# Patient Record
Sex: Male | Born: 1965 | Race: White | Hispanic: No | Marital: Married | Smoking: Never smoker
Health system: Southern US, Community
[De-identification: ages and names within clinical notes are randomized; demographics above are authoritative.]

## PROBLEM LIST (undated history)

## (undated) DIAGNOSIS — M792 Neuralgia and neuritis, unspecified: Secondary | ICD-10-CM

## (undated) DIAGNOSIS — F419 Anxiety disorder, unspecified: Secondary | ICD-10-CM

## (undated) DIAGNOSIS — G709 Myoneural disorder, unspecified: Secondary | ICD-10-CM

## (undated) DIAGNOSIS — E785 Hyperlipidemia, unspecified: Secondary | ICD-10-CM

## (undated) DIAGNOSIS — F32A Depression, unspecified: Secondary | ICD-10-CM

## (undated) HISTORY — PX: KNEE SURGERY: SHX244

## (undated) HISTORY — PX: ANKLE SURGERY: SHX546

## (undated) HISTORY — DX: Hyperlipidemia, unspecified: E78.5

## (undated) HISTORY — DX: Neuralgia and neuritis, unspecified: M79.2

## (undated) HISTORY — PX: COLONOSCOPY: SHX174

## (undated) HISTORY — PX: DENTAL EXAMINATION UNDER ANESTHESIA: SHX1447

---

## 1998-06-23 HISTORY — PX: HERNIA REPAIR: SHX51

## 2001-01-01 ENCOUNTER — Ambulatory Visit (HOSPITAL_COMMUNITY): Admission: RE | Admit: 2001-01-01 | Discharge: 2001-01-01 | Payer: Self-pay | Admitting: Neurosurgery

## 2001-01-01 ENCOUNTER — Encounter: Payer: Self-pay | Admitting: Neurosurgery

## 2004-04-11 ENCOUNTER — Ambulatory Visit: Payer: Self-pay | Admitting: Physician Assistant

## 2013-08-29 ENCOUNTER — Institutional Professional Consult (permissible substitution): Payer: BC Managed Care – PPO | Admitting: Neurology

## 2013-09-05 ENCOUNTER — Encounter (INDEPENDENT_AMBULATORY_CARE_PROVIDER_SITE_OTHER): Payer: Self-pay

## 2013-09-05 ENCOUNTER — Ambulatory Visit (INDEPENDENT_AMBULATORY_CARE_PROVIDER_SITE_OTHER): Payer: BC Managed Care – PPO | Admitting: Neurology

## 2013-09-05 ENCOUNTER — Encounter: Payer: Self-pay | Admitting: Neurology

## 2013-09-05 ENCOUNTER — Telehealth: Payer: Self-pay | Admitting: Neurology

## 2013-09-05 VITALS — BP 137/56 | HR 58 | Ht 66.0 in | Wt 173.0 lb

## 2013-09-05 DIAGNOSIS — R202 Paresthesia of skin: Secondary | ICD-10-CM

## 2013-09-05 DIAGNOSIS — M79609 Pain in unspecified limb: Secondary | ICD-10-CM | POA: Insufficient documentation

## 2013-09-05 DIAGNOSIS — R209 Unspecified disturbances of skin sensation: Secondary | ICD-10-CM

## 2013-09-05 NOTE — Telephone Encounter (Signed)
Hector HarmanDana, let him know, I have ordered pain management refer

## 2013-09-05 NOTE — Progress Notes (Signed)
PATIENT: Hector Gonzalez DOB: 30-Sep-1965  HISTORICAL  Hector Gonzalez is a 48 years old right-handed Caucasian male, accompanied by his wife, referred by his primary care physician Dr. Sherryll Burger for evaluation of the achy pain  He has past medical history of hyperlipidemia, diet controlled, depression, anxiety, he complains 25 years history of intermittent body aching pain,  Initial symptoms started about 25 years ago, 1990s, he started to experience hot needle sensation from his mid section, going down to bilateral groin area, bilateral legs, lasting for 18 months, he was put on pain medication, his symptoms gradually disappeared,  About 8 years ago, in 2007, he began to experience similar pain again, bilateral groin area shooting pain, bilateral calf area needle prick sensation, he was put on opana er 20 mg twice a day, also underwent extensive neurological evaluations, was reported normal, I reviewed his MRI of cervical in 2002, there is mild multilevel degenerative disc disease, but there was no significant foraminal or canal stenosis, he also had MRI lumbar in 2001, there was no significant foraminal, or canal stenosis. He  is also taking gabapentin 300 mg 3 times a day, setraline 125 mg every day, his symptoms was under good control until about 2 months ago, in January 2015, he began to experience flareup of the pain again, complains of bilateral calf area needle sensation, intermittent bilateral shoulder, forearm pain, and also behind ear pain,  But there was no limitation on his daily function, he was able to exercise one hour a day, including weight shifting, without any difficulty, he denies gait difficulty, he denies bowel and bladder incontinence  Recent laboratory showed normal CMP, CBC, elevated LDL 235, triglyceride 2:15, normal B12 891, TSH 2 point 1 7  REVIEW OF SYSTEMS: Full 14 system review of systems performed and notable only for itching, diarrhea, feeling cold, skin sensitivity,  tremor, depression, anxiety, not enough sleep  ALLERGIES: No Known Allergies  HOME MEDICATIONS: Gabapentin 300 mg 3 times a day Zoloft 125 mg a day Opana ER and 20 mg twice a day Ibuprofen 200 mg 6 tablets a day B complex   PAST MEDICAL HISTORY: Past Medical History  Diagnosis Date  . Nerve pain     PAST SURGICAL HISTORY: Past Surgical History  Procedure Laterality Date  . Ankle surgery    . Knee surgery      FAMILY HISTORY: Family History  Problem Relation Age of Onset  . Cancer    . Diabetes    . Mental illness      SOCIAL HISTORY:  History   Social History  . Marital Status: Single    Spouse Name: N/A    Number of Children: 1  . Years of Education: 12   Occupational History  .      [employer]   Social History Main Topics  . Smoking status: Never Smoker   . Smokeless tobacco: Never Used  . Alcohol Use: No  . Drug Use: No  . Sexual Activity: Not on file   Other Topics Concern  . Not on file   Social History Narrative   Patients lives at home with his wife [name] and works full time for [employer] school education   Right handed   Caffeine quit      PHYSICAL EXAM   Filed Vitals:   09/05/13 0814  BP: 137/56  Pulse: 58  Height: 5\' 6"  (1.676 m)  Weight: 173 lb (78.472 kg)  Not recorded    Body mass index is 27.94 kg/(m^2).   Generalized: In no acute distress  Neck: Supple, no carotid bruits   Cardiac: Regular rate rhythm  Pulmonary: Clear to auscultation bilaterally  Musculoskeletal: No deformity  Neurological examination  Mentation: Alert oriented to time, place, history taking, and causual conversation  Cranial nerve II-XII: Pupils were equal round reactive to light. Extraocular movements were full.  Visual field were full on confrontational test. Bilateral fundi were sharp.  Facial sensation and strength were normal. Hearing was intact to finger rubbing bilaterally. Uvula tongue midline.  Head turning and  shoulder shrug and were normal and symmetric.Tongue protrusion into cheek strength was normal.  Motor: Normal tone, bulk and strength.  Sensory: Intact to fine touch, pinprick, preserved vibratory sensation, and proprioception at toes.  Coordination: Normal finger to nose, heel-to-shin bilaterally there was no truncal ataxia  Gait: Rising up from seated position without assistance, normal stance, without trunk ataxia, moderate stride, good arm swing, smooth turning, able to perform tiptoe, and heel walking without difficulty.   Romberg signs: Negative  Deep tendon reflexes: Brachioradialis 2/2, biceps 2/2, triceps 2/2, patellar 3/3, Achilles 2/2, plantar responses were flexor bilaterally.   DIAGNOSTIC DATA (LABS, IMAGING, TESTING) - I reviewed patient records, labs, notes, testing and imaging myself where available.  ASSESSMENT AND PLAN  Mariea StableMickey G Boening is a 48 y.o. male with complains of diffuse body aching pain, essentially normal neurological examinations  I will do blood evaluation to rule out myositis, inflammatory myopathy, I will call him  report,   There is no evidence of peripheral neuropathy, he has brisk bilateral lower extremity deep tendon reflexes, but he is highly function, denies significant neck pain, differentiation diagnosis including cervical spondylitic myelopathy, after discussed with patient, will hold off imaging study at this point,  He may try to optimize pain management through his primary care physician, may consider tapering of gabapentin, add on cymbalta  Ami return to clinic for new issues   Levert FeinsteinYijun Natelie Ostrosky, M.D. Ph.D.  [facility] [address], [address] [place], [place] [address] [phone]

## 2013-09-05 NOTE — Telephone Encounter (Signed)
Message copied by Levert FeinsteinYAN, Jatavis Malek on Mon Sep 05, 2013  4:55 PM ------      Message from: COX, DANA R      Created: Mon Sep 05, 2013 10:23 AM       Patients wants a referral to pain Management. ------

## 2013-09-06 LAB — ANA W/REFLEX IF POSITIVE: Anti Nuclear Antibody(ANA): NEGATIVE

## 2013-09-06 LAB — SEDIMENTATION RATE: Sed Rate: 6 mm/hr (ref 0–30)

## 2013-09-06 LAB — CK: Total CK: 269 U/L — ABNORMAL HIGH (ref 24–204)

## 2013-09-06 LAB — C-REACTIVE PROTEIN: CRP: 0.6 mg/L (ref 0.0–4.9)

## 2013-09-07 NOTE — Progress Notes (Signed)
Quick Note:  Called to share normal labs per Dr Terrace ArabiaYan, patient verbalized understanding ______

## 2014-12-04 ENCOUNTER — Other Ambulatory Visit: Payer: Self-pay | Admitting: Family Medicine

## 2014-12-04 DIAGNOSIS — M7918 Myalgia, other site: Secondary | ICD-10-CM

## 2014-12-19 ENCOUNTER — Ambulatory Visit (INDEPENDENT_AMBULATORY_CARE_PROVIDER_SITE_OTHER): Payer: BLUE CROSS/BLUE SHIELD | Admitting: Family Medicine

## 2014-12-19 ENCOUNTER — Encounter: Payer: Self-pay | Admitting: Family Medicine

## 2014-12-19 VITALS — BP 124/74 | HR 70 | Temp 97.9°F | Ht 66.0 in | Wt 166.2 lb

## 2014-12-19 DIAGNOSIS — F1120 Opioid dependence, uncomplicated: Secondary | ICD-10-CM | POA: Insufficient documentation

## 2014-12-19 DIAGNOSIS — M7918 Myalgia, other site: Secondary | ICD-10-CM

## 2014-12-19 DIAGNOSIS — G8929 Other chronic pain: Secondary | ICD-10-CM

## 2014-12-19 DIAGNOSIS — M792 Neuralgia and neuritis, unspecified: Secondary | ICD-10-CM | POA: Insufficient documentation

## 2014-12-19 DIAGNOSIS — E785 Hyperlipidemia, unspecified: Secondary | ICD-10-CM | POA: Insufficient documentation

## 2014-12-19 DIAGNOSIS — M791 Myalgia: Secondary | ICD-10-CM

## 2014-12-19 DIAGNOSIS — Z79891 Long term (current) use of opiate analgesic: Secondary | ICD-10-CM | POA: Insufficient documentation

## 2014-12-19 DIAGNOSIS — F329 Major depressive disorder, single episode, unspecified: Secondary | ICD-10-CM | POA: Insufficient documentation

## 2014-12-19 DIAGNOSIS — Z1389 Encounter for screening for other disorder: Secondary | ICD-10-CM | POA: Insufficient documentation

## 2014-12-19 DIAGNOSIS — F32A Depression, unspecified: Secondary | ICD-10-CM | POA: Insufficient documentation

## 2014-12-19 MED ORDER — OXYMORPHONE HCL ER 5 MG PO TB12: 5.0000 mg | ORAL_TABLET | Freq: Every morning | ORAL | Status: DC

## 2014-12-19 MED ORDER — OXYMORPHONE HCL ER 20 MG PO TB12: 20.0000 mg | ORAL_TABLET | Freq: Two times a day (BID) | ORAL | Status: DC

## 2014-12-19 NOTE — Progress Notes (Signed)
Name: Hector Gonzalez   MRN: [id number]    DOB: Oct 27, 1965   Date:12/19/2014       Progress Note  Subjective  Chief Complaint  Chief Complaint  Patient presents with  . Back Pain    1 month f/u on med    HPI Pt. Is here for follow up of myofascial pain syndrome. Pain starts in the lower back and radiates forward to his groin and inner thighs. Pain is described as sharp and burning. It is a 3/10 today. Pain is worse with activity, bending, and twisting, while pain is relieved with pain medications, and resting. Additional treatment includes Sertraline, and Gabapentin.    Past Medical History  Diagnosis Date  . Nerve pain     Past Surgical History  Procedure Laterality Date  . Ankle surgery    . Knee surgery      Family History  Problem Relation Age of Onset  . Cancer    . Diabetes    . Mental illness    . Cancer Mother     History   Social History  . Marital Status: Single    Spouse Name: N/A  . Number of Children: 1  . Years of Education: 12   Occupational History  .      [employer]   Social History Main Topics  . Smoking status: Never Smoker   . Smokeless tobacco: Never Used  . Alcohol Use: No  . Drug Use: No  . Sexual Activity: Not on file   Other Topics Concern  . Not on file   Social History Narrative   Patients lives at home with his wife [name] and works full time for [employer] school education   Right handed   Caffeine quit      Current outpatient prescriptions:  .  b complex vitamins capsule, Take 1 capsule by mouth daily., Disp: , Rfl:  .  gabapentin (NEURONTIN) 600 MG tablet, Take 600 mg by mouth daily., Disp: , Rfl:  .  ibuprofen (ADVIL,MOTRIN) 200 MG tablet, Take 200 mg by mouth every 6 (six) hours as needed., Disp: , Rfl:  .  oxymorphone (OPANA ER) 20 MG 12 hr tablet, Take 20 mg by mouth daily., Disp: , Rfl:  .  sertraline (ZOLOFT) 100 MG tablet, TAKE ONE TABLET BY MOUTH EVERY DAY, Disp: 30 tablet, Rfl: 2  No Known  Allergies   Review of Systems  Cardiovascular: Negative for chest pain.  Gastrointestinal: Negative for abdominal pain.  Genitourinary: Negative for dysuria, urgency and frequency.  Musculoskeletal: Positive for myalgias and back pain.  Psychiatric/Behavioral: Negative for depression. The patient is not nervous/anxious.       Objective  Filed Vitals:   12/19/14 1638  BP: 124/74  Pulse: 70  Temp: 97.9 F (36.6 C)  TempSrc: Oral  Height:  (1.676 m)  Weight: 166 lb 3.2 oz (75.388 kg)  SpO2: 99%    Physical Exam  Constitutional: He is well-developed, well-nourished, and in no distress.  HENT:  Head: Normocephalic and atraumatic.  Cardiovascular: Normal rate and regular rhythm.   Pulmonary/Chest: Effort normal and breath sounds normal.  Abdominal: Soft. Bowel sounds are normal.  Musculoskeletal:       Lumbar back: He exhibits spasm. He exhibits no tenderness and no pain.  Nursing note and vitals reviewed.      No results found for this or any previous visit (from the past 2160 hour(s)).   Assessment & Plan 1. Chronic  myofascial pain Patient has chronic myofascial pain, well controlled on present opioid, antidepressant, and anticonvulsant therapy. Patient is blind with the controlled substances agreement. Refills provided follow-up in one month. - oxymorphone (OPANA ER) 20 MG 12 hr tablet; Take 1 tablet (20 mg total) by mouth every 12 (twelve) hours.  Dispense: 60 tablet; Refill: 0 - oxymorphone (OPANA ER) 5 MG 12 hr tablet; Take 1 tablet (5 mg total) by mouth every morning. Take with Opana ER 20 mg AM dose.  Dispense: 30 tablet; Refill: 0  There are no diagnoses linked to this encounter.  Aurthur Wingerter Asad A. [facility] 12/19/2014 5:10 PM

## 2015-01-18 ENCOUNTER — Ambulatory Visit (INDEPENDENT_AMBULATORY_CARE_PROVIDER_SITE_OTHER): Payer: BLUE CROSS/BLUE SHIELD | Admitting: Family Medicine

## 2015-01-18 ENCOUNTER — Encounter (INDEPENDENT_AMBULATORY_CARE_PROVIDER_SITE_OTHER): Payer: Self-pay

## 2015-01-18 ENCOUNTER — Encounter: Payer: Self-pay | Admitting: Family Medicine

## 2015-01-18 ENCOUNTER — Other Ambulatory Visit: Payer: Self-pay | Admitting: Family Medicine

## 2015-01-18 VITALS — BP 118/79 | HR 68 | Temp 98.0°F | Resp 17 | Ht 66.0 in | Wt 165.9 lb

## 2015-01-18 DIAGNOSIS — M7918 Myalgia, other site: Secondary | ICD-10-CM

## 2015-01-18 DIAGNOSIS — G8929 Other chronic pain: Secondary | ICD-10-CM | POA: Diagnosis not present

## 2015-01-18 DIAGNOSIS — M791 Myalgia: Secondary | ICD-10-CM

## 2015-01-18 MED ORDER — OXYMORPHONE HCL ER 20 MG PO TB12: 20.0000 mg | ORAL_TABLET | Freq: Two times a day (BID) | ORAL | Status: DC

## 2015-01-18 MED ORDER — OXYMORPHONE HCL ER 5 MG PO TB12: 5.0000 mg | ORAL_TABLET | Freq: Every morning | ORAL | Status: DC

## 2015-01-18 NOTE — Progress Notes (Signed)
Name: Hector Gonzalez   MRN: [id number]    DOB: September 11, 1965   Date:01/18/2015       Progress Note  Subjective  Chief Complaint  Chief Complaint  Patient presents with  . Medication Refill  . Hyperlipidemia    HPI Pt. Is here for medication refills for chronic myofascial pain syndrome. He has chronic pain in his lower back, bilateral flank pain, and it radiates down to his upper thighs bilaterally. He is on long-acting oral opioid therapy, Gabapentin, and an anti-depressant. He reports that pain is well-controlled on current regimen and no side effects.   Past Medical History  Diagnosis Date  . Nerve pain   . Hyperlipidemia     Past Surgical History  Procedure Laterality Date  . Ankle surgery    . Knee surgery      Family History  Problem Relation Age of Onset  . Cancer    . Diabetes    . Mental illness    . Cancer Mother     History   Social History  . Marital Status: Single    Spouse Name: N/A  . Number of Children: 1  . Years of Education: 12   Occupational History  .      [employer]   Social History Main Topics  . Smoking status: Never Smoker   . Smokeless tobacco: Never Used  . Alcohol Use: No  . Drug Use: No  . Sexual Activity: Not on file   Other Topics Concern  . Not on file   Social History Narrative   Patients lives at home with his wife Hector Gonzalez and works full time for [employer] school education   Right handed   Caffeine quit      Current outpatient prescriptions:  .  b complex vitamins capsule, Take 1 capsule by mouth daily., Disp: , Rfl:  .  gabapentin (NEURONTIN) 600 MG tablet, Take 600 mg by mouth daily., Disp: , Rfl:  .  ibuprofen (ADVIL,MOTRIN) 200 MG tablet, Take 200 mg by mouth every 6 (six) hours as needed., Disp: , Rfl:  .  oxymorphone (OPANA ER) 20 MG 12 hr tablet, Take 1 tablet (20 mg total) by mouth every 12 (twelve) hours., Disp: 60 tablet, Rfl: 0 .  oxymorphone (OPANA ER) 5 MG 12 hr tablet, Take 1 tablet (5 mg  total) by mouth every morning. Take with Opana ER 20 mg AM dose., Disp: 30 tablet, Rfl: 0 .  sertraline (ZOLOFT) 100 MG tablet, TAKE ONE TABLET BY MOUTH EVERY DAY, Disp: 30 tablet, Rfl: 2  No Known Allergies   Review of Systems  Gastrointestinal: Negative for abdominal pain.  Musculoskeletal: Positive for myalgias and back pain.      Objective  Filed Vitals:   01/18/15 1521  BP: 118/79  Pulse: 68  Temp: 98 F (36.7 C)  TempSrc: Oral  Resp: 17  Height: 5\' 6"  (1.676 m)  Weight: 165 lb 14.4 oz (75.252 kg)  SpO2: 97%    Physical Exam  Constitutional: He is well-developed, well-nourished, and in no distress.  Cardiovascular: Normal rate and regular rhythm.   Pulmonary/Chest: Effort normal and breath sounds normal.  Abdominal: Soft. Bowel sounds are normal.  Musculoskeletal:       Lumbar back: He exhibits normal range of motion, no tenderness, no pain and no spasm.  Nursing note and vitals reviewed.     Assessment & Plan 1. Chronic myofascial pain Symptoms of chronic myofascial pain are stable and  controlled on present opioid therapy. Patient is compliant with the controlled substances agreement. Refills provided and follow-up in one month - oxymorphone (OPANA ER) 20 MG 12 hr tablet; Take 1 tablet (20 mg total) by mouth every 12 (twelve) hours.  Dispense: 60 tablet; Refill: 0 - oxymorphone (OPANA ER) 5 MG 12 hr tablet; Take 1 tablet (5 mg total) by mouth every morning. Take with Opana ER 20 mg AM dose.  Dispense: 30 tablet; Refill: 0    Hector Gonzalez [name] 01/18/2015 3:51 PM

## 2015-02-15 ENCOUNTER — Ambulatory Visit (INDEPENDENT_AMBULATORY_CARE_PROVIDER_SITE_OTHER): Payer: BLUE CROSS/BLUE SHIELD | Admitting: Family Medicine

## 2015-02-15 ENCOUNTER — Encounter: Payer: Self-pay | Admitting: Family Medicine

## 2015-02-15 VITALS — BP 118/75 | HR 78 | Temp 98.6°F | Resp 18 | Ht 66.0 in | Wt 165.4 lb

## 2015-02-15 DIAGNOSIS — M791 Myalgia: Secondary | ICD-10-CM

## 2015-02-15 DIAGNOSIS — G8929 Other chronic pain: Secondary | ICD-10-CM | POA: Diagnosis not present

## 2015-02-15 DIAGNOSIS — M25522 Pain in left elbow: Secondary | ICD-10-CM | POA: Insufficient documentation

## 2015-02-15 DIAGNOSIS — M7918 Myalgia, other site: Secondary | ICD-10-CM

## 2015-02-15 MED ORDER — OXYMORPHONE HCL ER 5 MG PO TB12: 5.0000 mg | ORAL_TABLET | Freq: Every morning | ORAL | Status: DC

## 2015-02-15 MED ORDER — OXYMORPHONE HCL ER 20 MG PO TB12: 20.0000 mg | ORAL_TABLET | Freq: Two times a day (BID) | ORAL | Status: DC

## 2015-02-15 NOTE — Progress Notes (Signed)
Name: Hector Gonzalez   MRN: [id number]    DOB: 06-03-1966   Date:02/15/2015       Progress Note  Subjective  Chief Complaint  Chief Complaint  Patient presents with  . Medication Refill    Opana ER 20 mg twice a day / 5 mg once in the morning daily  . Hyperlipidemia    HPI Left. Elbow Pain Pt. Is here for left elbow pain, present for 2 weeks. Pain started without any known incident. Pt. Has difficulty grabbing a cup of coffee two days ago, and still has pain on making a grasping movement with his hand.  Pt. also has history of myofascial pain syndrome, described as radiating pain starting in the lower back and traveling down his pelvic area and down to his upper thighs. Pain is controlled on Opana 20 mg ER twice daily and 5 mg once in the morning. In addition, he also takes Gabapentin and Setraline. No side effects reported from opioid therapy.  Past Medical History  Diagnosis Date  . Nerve pain   . Hyperlipidemia     Past Surgical History  Procedure Laterality Date  . Ankle surgery    . Knee surgery      Family History  Problem Relation Age of Onset  . Cancer    . Diabetes    . Mental illness    . Cancer Mother     Social History   Social History  . Marital Status: Single    Spouse Name: N/A  . Number of Children: 1  . Years of Education: 12   Occupational History  .      [employer]   Social History Main Topics  . Smoking status: Never Smoker   . Smokeless tobacco: Never Used  . Alcohol Use: No  . Drug Use: No  . Sexual Activity: Not on file   Other Topics Concern  . Not on file   Social History Narrative   Patients lives at home with his wife Hector Gonzalez and works full time for [employer] school education   Right handed   Caffeine quit      Current outpatient prescriptions:  .  b complex vitamins capsule, Take 1 capsule by mouth daily., Disp: , Rfl:  .  gabapentin (NEURONTIN) 600 MG tablet, Take 600 mg by mouth daily., Disp: , Rfl:  .   ibuprofen (ADVIL,MOTRIN) 200 MG tablet, Take 200 mg by mouth every 6 (six) hours as needed., Disp: , Rfl:  .  oxymorphone (OPANA ER) 20 MG 12 hr tablet, Take 1 tablet (20 mg total) by mouth every 12 (twelve) hours., Disp: 60 tablet, Rfl: 0 .  oxymorphone (OPANA ER) 5 MG 12 hr tablet, Take 1 tablet (5 mg total) by mouth every morning. Take with Opana ER 20 mg AM dose., Disp: 30 tablet, Rfl: 0 .  sertraline (ZOLOFT) 100 MG tablet, TAKE ONE TABLET BY MOUTH EVERY DAY, Disp: 30 tablet, Rfl: 2  No Known Allergies   Review of Systems  Constitutional: Negative for fever, chills and weight loss.  Cardiovascular: Negative for chest pain and palpitations.  Gastrointestinal: Negative for abdominal pain.  Musculoskeletal: Positive for back pain and joint pain.      Objective  Filed Vitals:   02/15/15 1614  BP: 118/75  Pulse: 78  Temp: 98.6 F (37 C)  TempSrc: Oral  Resp: 18  Height:  (1.676 m)  Weight: 165 lb 6.4 oz (75.025 kg)  SpO2: 96%  Physical Exam  Constitutional: He is oriented to person, place, and time and well-developed, well-nourished, and in no distress.  Cardiovascular: Normal rate and regular rhythm.   Pulmonary/Chest: Effort normal and breath sounds normal.  Abdominal: Soft. Bowel sounds are normal.  Musculoskeletal:       Left elbow: He exhibits no swelling and no deformity. Tenderness found. Lateral epicondyle tenderness noted.       Lumbar back: He exhibits normal range of motion, no tenderness and no pain.  Neurological: He is alert and oriented to person, place, and time.  Skin: Skin is warm and dry.  Psychiatric: Memory, affect and judgment normal.  Nursing note and vitals reviewed.   Assessment & Plan  1. Chronic myofascial pain Stable on present opioid therapy. Patient is aware of the dependence potential, side effects, and drug interactions of opioids. He is compliant with the controlled substances agreement. Refills provided and follow-up in one  month.  - oxymorphone (OPANA ER) 20 MG 12 hr tablet; Take 1 tablet (20 mg total) by mouth every 12 (twelve) hours.  Dispense: 60 tablet; Refill: 0 - oxymorphone (OPANA ER) 5 MG 12 hr tablet; Take 1 tablet (5 mg total) by mouth every morning. Take with Opana ER 20 mg AM dose.  Dispense: 30 tablet; Refill: 0  2. Pain in left elbow Pt. advised to use OTC NSAIDs as needed. Obtain x-ray for evaluation.  - DG Elbow 2 Views Left; Future   [name] [name] 02/15/2015 4:27 PM

## 2015-03-05 ENCOUNTER — Other Ambulatory Visit: Payer: Self-pay | Admitting: Family Medicine

## 2015-03-06 NOTE — Telephone Encounter (Signed)
Medication has been refilled and sent to [facility] 

## 2015-03-19 ENCOUNTER — Ambulatory Visit (INDEPENDENT_AMBULATORY_CARE_PROVIDER_SITE_OTHER): Payer: BLUE CROSS/BLUE SHIELD | Admitting: Family Medicine

## 2015-03-19 ENCOUNTER — Encounter: Payer: Self-pay | Admitting: Family Medicine

## 2015-03-19 VITALS — BP 120/72 | HR 72 | Temp 97.8°F | Resp 16 | Ht 66.0 in | Wt 168.7 lb

## 2015-03-19 DIAGNOSIS — L749 Eccrine sweat disorder, unspecified: Secondary | ICD-10-CM | POA: Insufficient documentation

## 2015-03-19 DIAGNOSIS — M7918 Myalgia, other site: Secondary | ICD-10-CM

## 2015-03-19 DIAGNOSIS — M791 Myalgia: Secondary | ICD-10-CM

## 2015-03-19 DIAGNOSIS — G8929 Other chronic pain: Secondary | ICD-10-CM | POA: Diagnosis not present

## 2015-03-19 MED ORDER — OXYMORPHONE HCL ER 20 MG PO TB12: 20.0000 mg | ORAL_TABLET | Freq: Two times a day (BID) | ORAL | Status: DC

## 2015-03-19 MED ORDER — OXYMORPHONE HCL ER 5 MG PO TB12: 5.0000 mg | ORAL_TABLET | Freq: Every morning | ORAL | Status: DC

## 2015-03-19 NOTE — Progress Notes (Signed)
Name: Hector Gonzalez   MRN: [id number]    DOB: 20-May-1966   Date:03/19/2015       Progress Note  Subjective  Chief Complaint  Chief Complaint  Patient presents with  . Pain    patient is here for his 68-month f/u for Chronic Myofascial Pain  . Medication Refill    Opana  &     HPI  Myofascial Pain Syndrome  Pt. Is here for follow up of myofascial pain syndrome, described as pain radiating from his lower back down into his lower abdomen and thighs. He is on Opana 20 mg two times daily and 5 mg daily. He is also taking Gabapentin and Sertaline. Pain is controlled. No medication side effects reported.   Change in Sweat Odor Noticed over 2-3 months ago, change in sweat odor from the usual to one resembling acidic, vinegar-like smell. He has noticed no changes in appetite, no weight loss, no GI symptoms. He has not changed his diet in any way.   Past Medical History  Diagnosis Date  . Nerve pain   . Hyperlipidemia     Past Surgical History  Procedure Laterality Date  . Ankle surgery    . Knee surgery      Family History  Problem Relation Age of Onset  . Cancer    . Diabetes    . Mental illness    . Cancer Mother     Social History   Social History  . Marital Status: Single    Spouse Name: N/A  . Number of Children: 1  . Years of Education: 12   Occupational History  .      [employer]   Social History Main Topics  . Smoking status: Never Smoker   . Smokeless tobacco: Never Used  . Alcohol Use: No  . Drug Use: No  . Sexual Activity: Not on file   Other Topics Concern  . Not on file   Social History Narrative   Patients lives at home with his wife Hector Gonzalez and works full time for [employer] school education   Right handed   Caffeine quit     Current outpatient prescriptions:  .  b complex vitamins capsule, Take 1 capsule by mouth daily., Disp: , Rfl:  .  gabapentin (NEURONTIN) 600 MG tablet, Take 600 mg by mouth daily., Disp: , Rfl:  .   ibuprofen (ADVIL,MOTRIN) 200 MG tablet, Take 200 mg by mouth every 6 (six) hours as needed., Disp: , Rfl:  .  oxymorphone (OPANA ER) 20 MG 12 hr tablet, Take 1 tablet (20 mg total) by mouth every 12 (twelve) hours., Disp: 60 tablet, Rfl: 0 .  oxymorphone (OPANA ER) 5 MG 12 hr tablet, Take 1 tablet (5 mg total) by mouth every morning. Take with Opana ER 20 mg AM dose., Disp: 30 tablet, Rfl: 0 .  sertraline (ZOLOFT) 100 MG tablet, Take 1 tablet (100 mg total) by mouth daily., Disp: 30 tablet, Rfl: 0  No Known Allergies   Review of Systems  Gastrointestinal: Positive for abdominal pain.  Musculoskeletal: Positive for back pain and joint pain.   Objective  Filed Vitals:   03/19/15 1619  BP: 120/72  Pulse: 72  Temp: 97.8 F (36.6 C)  TempSrc: Oral  Resp: 16  Height:  (1.676 m)  Weight: 168 lb 11.2 oz (76.522 kg)  SpO2: 97%    Physical Exam  Constitutional: He is well-developed, well-nourished, and in no distress.  HENT:  Head: Normocephalic and atraumatic.  Cardiovascular: Normal rate and regular rhythm.   Pulmonary/Chest: Effort normal and breath sounds normal.  Abdominal: Soft. Bowel sounds are normal.  Nursing note and vitals reviewed.   Assessment & Plan  1. Chronic myofascial pain Symptoms stable on present opioid therapy. Refills provided. - oxymorphone (OPANA ER) 20 MG 12 hr tablet; Take 1 tablet (20 mg total) by mouth every 12 (twelve) hours.  Dispense: 60 tablet; Refill: 0 - oxymorphone (OPANA ER) 5 MG 12 hr tablet; Take 1 tablet (5 mg total) by mouth every morning. Take with Opana ER 20 mg AM dose.  Dispense: 30 tablet; Refill: 0  2. Sweating abnormality Unclear etiology for the change and slight border. Obtain labs and if unremarkable, consider referral to dermatology. - Hepatic function panel - Basic Metabolic Panel (BMET) - Urinalysis, Routine w reflex microscopic   Hector Gonzalez [facility] 03/19/2015 4:41 PM

## 2015-04-06 ENCOUNTER — Other Ambulatory Visit: Payer: Self-pay | Admitting: Family Medicine

## 2015-04-18 ENCOUNTER — Encounter: Payer: Self-pay | Admitting: Family Medicine

## 2015-04-18 ENCOUNTER — Other Ambulatory Visit: Payer: Self-pay | Admitting: Family Medicine

## 2015-04-18 ENCOUNTER — Ambulatory Visit (INDEPENDENT_AMBULATORY_CARE_PROVIDER_SITE_OTHER): Payer: BLUE CROSS/BLUE SHIELD | Admitting: Family Medicine

## 2015-04-18 VITALS — HR 75 | Temp 97.9°F | Resp 17 | Ht 66.0 in | Wt 166.2 lb

## 2015-04-18 DIAGNOSIS — M791 Myalgia: Secondary | ICD-10-CM

## 2015-04-18 DIAGNOSIS — G8929 Other chronic pain: Secondary | ICD-10-CM

## 2015-04-18 DIAGNOSIS — M7918 Myalgia, other site: Secondary | ICD-10-CM

## 2015-04-18 MED ORDER — OXYMORPHONE HCL ER 20 MG PO TB12: 20.0000 mg | ORAL_TABLET | Freq: Two times a day (BID) | ORAL | Status: DC

## 2015-04-18 MED ORDER — OXYMORPHONE HCL ER 5 MG PO TB12: 5.0000 mg | ORAL_TABLET | Freq: Every morning | ORAL | Status: DC

## 2015-04-18 NOTE — Progress Notes (Signed)
Name: Hector Gonzalez   MRN: [id number]    DOB: 10/15/1965   Date:04/18/2015       Progress Note  Subjective  Chief Complaint  Chief Complaint  Patient presents with  . Follow-up    1 mo  . Hyperlipidemia  . Medication Refill    opana 20mg  / gabapentin 600mg     HPI  Myofascial Pain Syndrome  Pt. Is here for follow up of myofascial pain syndrome, described as pain radiating from his lower back down into his lower abdomen and thighs. Pain is rated at 2-3/10He is on Opana 20 mg two times daily and 5 mg daily. He is also taking Gabapentin and Sertaline. Pain is controlled. No medication side effects reported.   Past Medical History  Diagnosis Date  . Nerve pain   . Hyperlipidemia     Past Surgical History  Procedure Laterality Date  . Ankle surgery    . Knee surgery      Family History  Problem Relation Age of Onset  . Cancer    . Diabetes    . Mental illness    . Cancer Mother     Social History   Social History  . Marital Status: Single    Spouse Name: N/A  . Number of Children: 1  . Years of Education: 12   Occupational History  .      [employer]   Social History Main Topics  . Smoking status: Never Smoker   . Smokeless tobacco: Never Used  . Alcohol Use: No  . Drug Use: No  . Sexual Activity: Not on file   Other Topics Concern  . Not on file   Social History Narrative   Patients lives at home with his wife Hector Gonzalez and works full time for [employer]   High school education   Right handed   Caffeine quit      Current outpatient prescriptions:  .  b complex vitamins capsule, Take 1 capsule by mouth daily., Disp: , Rfl:  .  gabapentin (NEURONTIN) 600 MG tablet, Take 600 mg by mouth daily., Disp: , Rfl:  .  ibuprofen (ADVIL,MOTRIN) 200 MG tablet, Take 200 mg by mouth every 6 (six) hours as needed., Disp: , Rfl:  .  oxymorphone (OPANA ER) 20 MG 12 hr tablet, Take 1 tablet (20 mg total) by mouth every 12 (twelve) hours., Disp: 60 tablet, Rfl: 0 .   oxymorphone (OPANA ER) 5 MG 12 hr tablet, Take 1 tablet (5 mg total) by mouth every morning. Take with Opana ER 20 mg AM dose., Disp: 30 tablet, Rfl: 0 .  sertraline (ZOLOFT) 100 MG tablet, TAKE ONE TABLET BY MOUTH DAILY, Disp: 30 tablet, Rfl: 2  No Known Allergies   Review of Systems  Gastrointestinal: Positive for abdominal pain.  Musculoskeletal: Positive for back pain.    Objective  Filed Vitals:   04/18/15 1619  Pulse: 75  Temp: 97.9 F (36.6 C)  TempSrc: Oral  Resp: 17  Height: 5\' 6"  (1.676 m)  Weight: 166 lb 3.2 oz (75.388 kg)  SpO2: 97%    Physical Exam  Constitutional: He is well-developed, well-nourished, and in no distress.  HENT:  Head: Normocephalic and atraumatic.  Cardiovascular: Normal rate and regular rhythm.   Pulmonary/Chest: Effort normal and breath sounds normal.  Abdominal: Soft. Bowel sounds are normal.  Nursing note and vitals reviewed.  Assessment & Plan  1. Chronic myofascial pain Symptoms stable on daily opioid therapy. Patient compliant with controlled  substances agreement. Refills provided and follow-up in one month. - oxymorphone (OPANA ER) 20 MG 12 hr tablet; Take 1 tablet (20 mg total) by mouth every 12 (twelve) hours.  Dispense: 60 tablet; Refill: 0 - oxymorphone (OPANA ER) 5 MG 12 hr tablet; Take 1 tablet (5 mg total) by mouth every morning. Take with Opana ER 20 mg AM dose.  Dispense: 30 tablet; Refill: 0   Hector Gonzalez [facility] 04/18/2015 4:52 PM

## 2015-04-19 LAB — URINALYSIS, ROUTINE W REFLEX MICROSCOPIC
Bilirubin, UA: NEGATIVE
GLUCOSE, UA: NEGATIVE
LEUKOCYTES UA: NEGATIVE
Nitrite, UA: NEGATIVE
PROTEIN UA: NEGATIVE
RBC, UA: NEGATIVE
Urobilinogen, Ur: 1 mg/dL (ref 0.2–1.0)
pH, UA: 6 (ref 5.0–7.5)

## 2015-04-19 LAB — HEPATIC FUNCTION PANEL
ALT: 38 IU/L (ref 0–44)
AST: 34 IU/L (ref 0–40)
Albumin: 4.5 g/dL (ref 3.5–5.5)
Alkaline Phosphatase: 58 IU/L (ref 39–117)
BILIRUBIN TOTAL: 0.6 mg/dL (ref 0.0–1.2)
BILIRUBIN, DIRECT: 0.16 mg/dL (ref 0.00–0.40)
Total Protein: 6.6 g/dL (ref 6.0–8.5)

## 2015-04-19 LAB — BASIC METABOLIC PANEL
BUN / CREAT RATIO: 11 (ref 9–20)
BUN: 13 mg/dL (ref 6–24)
CHLORIDE: 100 mmol/L (ref 97–106)
CO2: 27 mmol/L (ref 18–29)
Calcium: 9.3 mg/dL (ref 8.7–10.2)
Creatinine, Ser: 1.21 mg/dL (ref 0.76–1.27)
GFR calc Af Amer: 80 mL/min/{1.73_m2} (ref >59)
GFR calc non Af Amer: 69 mL/min/{1.73_m2} (ref >59)
GLUCOSE: 81 mg/dL (ref 65–99)
POTASSIUM: 4.6 mmol/L (ref 3.5–5.2)
Sodium: 143 mmol/L (ref 136–144)

## 2015-04-30 ENCOUNTER — Encounter (INDEPENDENT_AMBULATORY_CARE_PROVIDER_SITE_OTHER): Payer: Self-pay

## 2015-05-16 ENCOUNTER — Ambulatory Visit (INDEPENDENT_AMBULATORY_CARE_PROVIDER_SITE_OTHER): Payer: BLUE CROSS/BLUE SHIELD | Admitting: Family Medicine

## 2015-05-16 ENCOUNTER — Encounter: Payer: Self-pay | Admitting: Family Medicine

## 2015-05-16 VITALS — BP 118/79 | HR 75 | Temp 97.9°F | Resp 17 | Ht 66.0 in | Wt 168.6 lb

## 2015-05-16 DIAGNOSIS — H938X2 Other specified disorders of left ear: Secondary | ICD-10-CM

## 2015-05-16 DIAGNOSIS — M7918 Myalgia, other site: Secondary | ICD-10-CM

## 2015-05-16 DIAGNOSIS — J309 Allergic rhinitis, unspecified: Secondary | ICD-10-CM | POA: Diagnosis not present

## 2015-05-16 DIAGNOSIS — G8929 Other chronic pain: Secondary | ICD-10-CM | POA: Diagnosis not present

## 2015-05-16 DIAGNOSIS — M791 Myalgia: Secondary | ICD-10-CM | POA: Diagnosis not present

## 2015-05-16 MED ORDER — OXYMORPHONE HCL ER 20 MG PO TB12: 20.0000 mg | ORAL_TABLET | Freq: Two times a day (BID) | ORAL | Status: DC

## 2015-05-16 MED ORDER — FLUTICASONE PROPIONATE 50 MCG/ACT NA SUSP: 2.0000 | Freq: Every day | NASAL | Status: DC

## 2015-05-16 MED ORDER — OXYMORPHONE HCL ER 5 MG PO TB12: 5.0000 mg | ORAL_TABLET | Freq: Every morning | ORAL | Status: DC

## 2015-05-16 NOTE — Progress Notes (Signed)
Name: Hector Gonzalez   MRN: [id number]    DOB: 07/19/1965   Date:05/16/2015       Progress Note  Subjective  Chief Complaint  Chief Complaint  Patient presents with  . Follow-up    1 mo  . Hyperlipidemia  . Medication Refill    opana 20mg  / opana 5mg    Ear Fullness  There is pain in the left ear. This is a new problem. Episode onset: 6 weeks. There has been no fever. Pertinent negatives include no abdominal pain, coughing, ear discharge, headaches, neck pain or sore throat. He has tried nothing for the symptoms. There is no history of a chronic ear infection, hearing loss or a tympanostomy tube.    Pt. Is here for follow-up of myofascial pain syndrome. Pain is present in the lower back, radiating down to his inner thighs. Pain is rated at 3/10, controlled on Opana 20 mg at night and 25 mg in AM. He also takes SSRI and Gabapentin.No side effects.  Past Medical History  Diagnosis Date  . Nerve pain   . Hyperlipidemia     Past Surgical History  Procedure Laterality Date  . Ankle surgery    . Knee surgery      Family History  Problem Relation Age of Onset  . Cancer    . Diabetes    . Mental illness    . Cancer Mother     Social History   Social History  . Marital Status: Single    Spouse Name: N/A  . Number of Children: 1  . Years of Education: 12   Occupational History  .      [employer]   Social History Main Topics  . Smoking status: Never Smoker   . Smokeless tobacco: Never Used  . Alcohol Use: No  . Drug Use: No  . Sexual Activity: Not on file   Other Topics Concern  . Not on file   Social History Narrative   Patients lives at home with his wife Hector Gonzalez and works full time for [employer]   High school education   Right handed   Caffeine quit      Current outpatient prescriptions:  .  b complex vitamins capsule, Take 1 capsule by mouth daily., Disp: , Rfl:  .  gabapentin (NEURONTIN) 600 MG tablet, Take 600 mg by mouth daily., Disp: , Rfl:  .   ibuprofen (ADVIL,MOTRIN) 200 MG tablet, Take 200 mg by mouth every 6 (six) hours as needed., Disp: , Rfl:  .  oxymorphone (OPANA ER) 20 MG 12 hr tablet, Take 1 tablet (20 mg total) by mouth every 12 (twelve) hours., Disp: 60 tablet, Rfl: 0 .  oxymorphone (OPANA ER) 5 MG 12 hr tablet, Take 1 tablet (5 mg total) by mouth every morning. Take with Opana ER 20 mg AM dose., Disp: 30 tablet, Rfl: 0 .  sertraline (ZOLOFT) 100 MG tablet, TAKE ONE TABLET BY MOUTH DAILY, Disp: 30 tablet, Rfl: 2  No Known Allergies   Review of Systems  Constitutional: Negative for fever and chills.  HENT: Negative for ear discharge, ear pain, sore throat and tinnitus.   Respiratory: Negative for cough.   Gastrointestinal: Negative for abdominal pain.  Musculoskeletal: Positive for back pain. Negative for neck pain.  Neurological: Negative for headaches.    Objective  Filed Vitals:   05/16/15 1547  BP: 118/79  Pulse: 75  Temp: 97.9 F (36.6 C)  TempSrc: Oral  Resp: 17  Height: 5'  6" (1.676 m)  Weight: 168 lb 9.6 oz (76.476 kg)  SpO2: 97%    Physical Exam  Constitutional: He is well-developed, well-nourished, and in no distress.  HENT:  Head: Normocephalic and atraumatic.  Right Ear: Tympanic membrane and ear canal normal.  Left Ear: External ear and ear canal normal.  Nose: Mucosal edema present. No sinus tenderness. Right sinus exhibits no maxillary sinus tenderness and no frontal sinus tenderness. Left sinus exhibits no maxillary sinus tenderness and no frontal sinus tenderness.  Mouth/Throat: No posterior oropharyngeal erythema.  TM not visualized in left ear, canal normal, no drainage or cerumen impaction.  Nasal turbinate hypertrophy,   Cardiovascular: Normal rate and regular rhythm.   No murmur heard. Pulmonary/Chest: Effort normal and breath sounds normal.  Abdominal: Soft. Bowel sounds are normal.  Nursing note and vitals reviewed.    Assessment & Plan  1. Sensation of fullness in left  ear  - Ambulatory referral to ENT  2. Chronic myofascial pain  Pain is stable and controlled on current therapy. Compliant with controlled substances agreement. Refills provided  - oxymorphone (OPANA ER) 20 MG 12 hr tablet; Take 1 tablet (20 mg total) by mouth every 12 (twelve) hours.  Dispense: 60 tablet; Refill: 0 - oxymorphone (OPANA ER) 5 MG 12 hr tablet; Take 1 tablet (5 mg total) by mouth every morning. Take with Opana ER 20 mg AM dose.  Dispense: 30 tablet; Refill: 0  3. Allergic rhinitis, unspecified allergic rhinitis type  - fluticasone (FLONASE) 50 MCG/ACT nasal spray; Place 2 sprays into both nostrils daily.  Dispense: 16 g; Refill: 0    Marque Rademaker [name] 05/16/2015 4:17 PM

## 2015-06-05 ENCOUNTER — Encounter: Payer: Self-pay | Admitting: Family Medicine

## 2015-06-05 ENCOUNTER — Ambulatory Visit (INDEPENDENT_AMBULATORY_CARE_PROVIDER_SITE_OTHER): Payer: BLUE CROSS/BLUE SHIELD | Admitting: Family Medicine

## 2015-06-05 VITALS — BP 122/78 | HR 72 | Temp 98.2°F | Resp 18 | Ht 66.0 in | Wt 170.2 lb

## 2015-06-05 DIAGNOSIS — G8929 Other chronic pain: Secondary | ICD-10-CM | POA: Diagnosis not present

## 2015-06-05 DIAGNOSIS — M791 Myalgia: Secondary | ICD-10-CM

## 2015-06-05 DIAGNOSIS — M7918 Myalgia, other site: Secondary | ICD-10-CM

## 2015-06-05 MED ORDER — OXYMORPHONE HCL ER 20 MG PO TB12: 20.0000 mg | ORAL_TABLET | Freq: Two times a day (BID) | ORAL | Status: DC

## 2015-06-05 MED ORDER — OXYMORPHONE HCL ER 5 MG PO TB12: 5.0000 mg | ORAL_TABLET | Freq: Every morning | ORAL | Status: DC

## 2015-06-05 NOTE — Progress Notes (Signed)
Name: Hector Gonzalez   MRN: [id number]    DOB: 1965-11-13   Date:06/05/2015       Progress Note  Subjective  Chief Complaint  Chief Complaint  Patient presents with  . chronic myofascial pain    pt here for 1 month follow up    HPI   Pt. Is here for follow-up of myofascial pain syndrome. Pain is present in the lower back, radiating down to his inner thighs. Pain is rated at 3/10 today, controlled on Opana 20 mg at night and 25 mg in AM. He also takes Sertraline and Gabapentin along with opioid therapy. No side effects reported.   Past Medical History  Diagnosis Date  . Nerve pain   . Hyperlipidemia     Past Surgical History  Procedure Laterality Date  . Ankle surgery    . Knee surgery      Family History  Problem Relation Age of Onset  . Cancer    . Diabetes    . Mental illness    . Cancer Mother     Social History   Social History  . Marital Status: Single    Spouse Name: N/A  . Number of Children: 1  . Years of Education: 12   Occupational History  .      [employer]   Social History Main Topics  . Smoking status: Never Smoker   . Smokeless tobacco: Never Used  . Alcohol Use: No  . Drug Use: No  . Sexual Activity: Not on file   Other Topics Concern  . Not on file   Social History Narrative   Patients lives at home with his wife [name] and works full time for [employer] school education   Right handed   Caffeine quit      Current outpatient prescriptions:  .  b complex vitamins capsule, Take 1 capsule by mouth daily., Disp: , Rfl:  .  fluticasone (FLONASE) 50 MCG/ACT nasal spray, Place 2 sprays into both nostrils daily., Disp: 16 g, Rfl: 0 .  gabapentin (NEURONTIN) 600 MG tablet, Take 600 mg by mouth daily., Disp: , Rfl:  .  ibuprofen (ADVIL,MOTRIN) 200 MG tablet, Take 200 mg by mouth every 6 (six) hours as needed., Disp: , Rfl:  .  oxymorphone (OPANA ER) 20 MG 12 hr tablet, Take 1 tablet (20 mg total) by mouth every 12 (twelve) hours.,  Disp: 60 tablet, Rfl: 0 .  oxymorphone (OPANA ER) 5 MG 12 hr tablet, Take 1 tablet (5 mg total) by mouth every morning. Take with Opana ER 20 mg AM dose., Disp: 30 tablet, Rfl: 0 .  sertraline (ZOLOFT) 100 MG tablet, TAKE ONE TABLET BY MOUTH DAILY, Disp: 30 tablet, Rfl: 2  No Known Allergies   Review of Systems  Constitutional: Negative for fever, chills, weight loss and malaise/fatigue.  Musculoskeletal: Positive for myalgias and back pain.  Neurological: Negative for dizziness and tingling.    Objective  Filed Vitals:   06/05/15 1525  BP: 122/78  Pulse: 72  Temp: 98.2 F (36.8 C)  Resp: 18  Height:  (1.676 m)  Weight: 170 lb 4 oz (77.225 kg)  SpO2: 97%    Physical Exam  Constitutional: He is oriented to person, place, and time and well-developed, well-nourished, and in no distress.  HENT:  Head: Normocephalic and atraumatic.  Cardiovascular: Normal rate and regular rhythm.   Pulmonary/Chest: Effort normal and breath sounds normal.  Abdominal: Soft. Bowel sounds are  normal.  Musculoskeletal:       Lumbar back: He exhibits no tenderness, no swelling, no pain and no spasm.  Neurological: He is alert and oriented to person, place, and time.  Nursing note and vitals reviewed.    Assessment & Plan  1. Chronic myofascial pain Neck pain controlled on opioid therapy. Patient compliant with controlled substances agreement. Refills provided and follow-up in one month - oxymorphone (OPANA ER) 20 MG 12 hr tablet; Take 1 tablet (20 mg total) by mouth every 12 (twelve) hours. Please fill on/after June 15, 2015  Dispense: 60 tablet; Refill: 0 - oxymorphone (OPANA ER) 5 MG 12 hr tablet; Take 1 tablet (5 mg total) by mouth every morning. Take with Opana ER 20 mg AM dose. Please fill on/after June 15, 2015.  Dispense: 30 tablet; Refill: 0   Angeline Trick [facility] 06/05/2015 3:35 PM

## 2015-06-06 ENCOUNTER — Ambulatory Visit: Payer: BLUE CROSS/BLUE SHIELD | Admitting: Family Medicine

## 2015-07-05 ENCOUNTER — Ambulatory Visit (INDEPENDENT_AMBULATORY_CARE_PROVIDER_SITE_OTHER): Payer: BLUE CROSS/BLUE SHIELD | Admitting: Family Medicine

## 2015-07-05 ENCOUNTER — Encounter: Payer: Self-pay | Admitting: Family Medicine

## 2015-07-05 VITALS — BP 136/82 | HR 84 | Temp 97.9°F | Resp 17 | Ht 66.0 in | Wt 169.9 lb

## 2015-07-05 DIAGNOSIS — G8929 Other chronic pain: Secondary | ICD-10-CM | POA: Diagnosis not present

## 2015-07-05 DIAGNOSIS — M791 Myalgia: Secondary | ICD-10-CM | POA: Diagnosis not present

## 2015-07-05 DIAGNOSIS — M7918 Myalgia, other site: Secondary | ICD-10-CM

## 2015-07-05 MED ORDER — OXYMORPHONE HCL ER 5 MG PO TB12: 5.0000 mg | ORAL_TABLET | Freq: Every morning | ORAL | Status: DC

## 2015-07-05 MED ORDER — OXYMORPHONE HCL ER 20 MG PO TB12: 20.0000 mg | ORAL_TABLET | Freq: Two times a day (BID) | ORAL | Status: DC

## 2015-07-05 NOTE — Progress Notes (Signed)
Name: Mariea StableMickey G Mages   MRN: [id number]    DOB: 03/18/1966   Date:07/05/2015       Progress Note  Subjective  Chief Complaint  Chief Complaint  Patient presents with  . Follow-up    1 mo  . Hyperlipidemia  . Medication Refill    opana ER 20mg  / opana ER 5 mg     HPI  Pt. Is here for follow-up of myofascial pain syndrome. Pain is present in the lower back, radiating down to his inner thighs. Pain is rated at 2/10 today, controlled on Opana 20 mg at night and 25 mg in AM. He also takes Sertraline and Gabapentin along with opioid therapy. No side effects reported.   Past Medical History  Diagnosis Date  . Nerve pain   . Hyperlipidemia     Past Surgical History  Procedure Laterality Date  . Ankle surgery    . Knee surgery      Family History  Problem Relation Age of Onset  . Cancer    . Diabetes    . Mental illness    . Cancer Mother     Social History   Social History  . Marital Status: Single    Spouse Name: N/A  . Number of Children: 1  . Years of Education: 12   Occupational History  .      [employer]   Social History Main Topics  . Smoking status: Never Smoker   . Smokeless tobacco: Never Used  . Alcohol Use: No  . Drug Use: No  . Sexual Activity: Not on file   Other Topics Concern  . Not on file   Social History Narrative   Patients lives at home with his wife [name] and works full time for [employer]   High school education   Right handed   Caffeine quit      Current outpatient prescriptions:  .  b complex vitamins capsule, Take 1 capsule by mouth daily., Disp: , Rfl:  .  fluticasone (FLONASE) 50 MCG/ACT nasal spray, Place 2 sprays into both nostrils daily., Disp: 16 g, Rfl: 0 .  gabapentin (NEURONTIN) 600 MG tablet, Take 600 mg by mouth daily., Disp: , Rfl:  .  ibuprofen (ADVIL,MOTRIN) 200 MG tablet, Take 200 mg by mouth every 6 (six) hours as needed., Disp: , Rfl:  .  oxymorphone (OPANA ER) 20 MG 12 hr tablet, Take 1 tablet (20 mg total)  by mouth every 12 (twelve) hours. Please fill on/after June 15, 2015, Disp: 60 tablet, Rfl: 0 .  oxymorphone (OPANA ER) 5 MG 12 hr tablet, Take 1 tablet (5 mg total) by mouth every morning. Take with Opana ER 20 mg AM dose. Please fill on/after June 15, 2015., Disp: 30 tablet, Rfl: 0 .  sertraline (ZOLOFT) 100 MG tablet, TAKE ONE TABLET BY MOUTH DAILY, Disp: 30 tablet, Rfl: 2  No Known Allergies   Review of Systems  Constitutional: Negative for fever, chills and weight loss.  Cardiovascular: Negative for chest pain.  Gastrointestinal: Negative for abdominal pain.  Musculoskeletal: Positive for myalgias and back pain.  Neurological: Negative for tingling and tremors.  Psychiatric/Behavioral: Positive for depression. The patient is nervous/anxious.       Objective  Filed Vitals:   07/05/15 1631  BP: 136/82  Pulse: 84  Temp: 97.9 F (36.6 C)  TempSrc: Oral  Resp: 17  Height: 5\' 6"  (1.676 m)  Weight: 169 lb 14.4 oz (77.066 kg)  SpO2: 97%  Physical Exam  Constitutional: He is oriented to person, place, and time and well-developed, well-nourished, and in no distress.  HENT:  Head: Normocephalic and atraumatic.  Cardiovascular: Normal rate and regular rhythm.   Pulmonary/Chest: Effort normal and breath sounds normal.  Abdominal: Soft. Bowel sounds are normal.  Musculoskeletal:       Lumbar back: He exhibits no tenderness and no swelling.  Neurological: He is alert and oriented to person, place, and time.  Nursing note and vitals reviewed.   Assessment & Plan  1. Chronic myofascial pain  stable on chronic opioid therapy. Compliant with controlled substances agreement. Refills provided. - oxymorphone (OPANA ER) 20 MG 12 hr tablet; Take 1 tablet (20 mg total) by mouth every 12 (twelve) hours. Please disp on/after January 22nd 2017  Dispense: 60 tablet; Refill: 0 - oxymorphone (OPANA ER) 5 MG 12 hr tablet; Take 1 tablet (5 mg total) by mouth every morning. Take with  Opana ER 20 mg AM dose. Please fill on/after January 22nd, 2017  Dispense: 30 tablet; Refill: 0   Nupur Hohman [name] 07/05/2015 4:44 PM

## 2015-07-09 ENCOUNTER — Ambulatory Visit: Payer: BLUE CROSS/BLUE SHIELD | Admitting: Family Medicine

## 2015-07-09 ENCOUNTER — Other Ambulatory Visit: Payer: Self-pay | Admitting: Family Medicine

## 2015-08-06 ENCOUNTER — Encounter: Payer: Self-pay | Admitting: Family Medicine

## 2015-08-06 ENCOUNTER — Ambulatory Visit (INDEPENDENT_AMBULATORY_CARE_PROVIDER_SITE_OTHER): Payer: BLUE CROSS/BLUE SHIELD | Admitting: Family Medicine

## 2015-08-06 VITALS — BP 137/69 | HR 67 | Temp 98.0°F | Resp 17 | Ht 66.0 in | Wt 173.0 lb

## 2015-08-06 DIAGNOSIS — M791 Myalgia: Secondary | ICD-10-CM

## 2015-08-06 DIAGNOSIS — G8929 Other chronic pain: Secondary | ICD-10-CM

## 2015-08-06 DIAGNOSIS — M7918 Myalgia, other site: Secondary | ICD-10-CM

## 2015-08-06 MED ORDER — OXYMORPHONE HCL ER 5 MG PO TB12: 5.0000 mg | ORAL_TABLET | Freq: Every morning | ORAL | Status: DC

## 2015-08-06 MED ORDER — OXYMORPHONE HCL ER 20 MG PO TB12: 20.0000 mg | ORAL_TABLET | Freq: Two times a day (BID) | ORAL | Status: DC

## 2015-08-06 NOTE — Progress Notes (Signed)
Name: Hector Gonzalez   MRN: [id number]    DOB: 08-23-1965   Date:08/06/2015       Progress Note  Subjective  Chief Complaint  Chief Complaint  Patient presents with  . Follow-up    1 mo  . Hyperlipidemia    HPI   Chronic Myofascial Pain  Pt. Is here for follow-up of myofascial pain syndrome. Pain is present in the lower back, radiating down to his inner thighs. Pain is rated at 3/10 today, controlled on Opana 20 mg at night and 25 mg in AM. He also takes Sertraline and Gabapentin along with opioid therapy. No side effects reported.  Past Medical History  Diagnosis Date  . Nerve pain   . Hyperlipidemia     Past Surgical History  Procedure Laterality Date  . Ankle surgery    . Knee surgery      Family History  Problem Relation Age of Onset  . Cancer    . Diabetes    . Mental illness    . Cancer Mother     Social History   Social History  . Marital Status: Single    Spouse Name: N/A  . Number of Children: 1  . Years of Education: 12   Occupational History  .      [employer]   Social History Main Topics  . Smoking status: Never Smoker   . Smokeless tobacco: Never Used  . Alcohol Use: No  . Drug Use: No  . Sexual Activity: Not on file   Other Topics Concern  . Not on file   Social History Narrative   Patients lives at home with his wife [name] and works full time for [employer] school education   Right handed   Caffeine quit      Current outpatient prescriptions:  .  b complex vitamins capsule, Take 1 capsule by mouth daily., Disp: , Rfl:  .  fluticasone (FLONASE) 50 MCG/ACT nasal spray, Place 2 sprays into both nostrils daily., Disp: 16 g, Rfl: 0 .  gabapentin (NEURONTIN) 600 MG tablet, Take 600 mg by mouth daily., Disp: , Rfl:  .  ibuprofen (ADVIL,MOTRIN) 200 MG tablet, Take 200 mg by mouth every 6 (six) hours as needed., Disp: , Rfl:  .  oxymorphone (OPANA ER) 20 MG 12 hr tablet, Take 1 tablet (20 mg total) by mouth every 12 (twelve)  hours. Please disp on/after January 22nd 2017, Disp: 60 tablet, Rfl: 0 .  oxymorphone (OPANA ER) 5 MG 12 hr tablet, Take 1 tablet (5 mg total) by mouth every morning. Take with Opana ER 20 mg AM dose. Please fill on/after January 22nd, 2017, Disp: 30 tablet, Rfl: 0 .  sertraline (ZOLOFT) 100 MG tablet, TAKE ONE TABLET EVERY DAY, Disp: 30 tablet, Rfl: 2  No Known Allergies   Review of Systems  Musculoskeletal: Positive for back pain (starts in lower back and spirals down to anterior thighs.).    Objective  Filed Vitals:   08/06/15 1620  BP: 137/69  Pulse: 67  Temp: 98 F (36.7 C)  TempSrc: Oral  Resp: 17  Height:  (1.676 m)  Weight: 173 lb (78.472 kg)  SpO2: 97%    Physical Exam  Constitutional: He is oriented to person, place, and time and well-developed, well-nourished, and in no distress.  Cardiovascular: Normal rate and regular rhythm.   Pulmonary/Chest: Effort normal and breath sounds normal.  Abdominal: Soft. Bowel sounds are normal.  Musculoskeletal:  Lumbar back: He exhibits no tenderness, no pain and no spasm.  Neurological: He is alert and oriented to person, place, and time.  Nursing note and vitals reviewed.     Assessment & Plan  1. Chronic myofascial pain  - oxymorphone (OPANA ER) 20 MG 12 hr tablet; Take 1 tablet (20 mg total) by mouth every 12 (twelve) hours. Please disp on/after January 22nd 2017  Dispense: 60 tablet; Refill: 0 - oxymorphone (OPANA ER) 5 MG 12 hr tablet; Take 1 tablet (5 mg total) by mouth every morning. Take with Opana ER 20 mg AM dose. Please fill on/after January 22nd, 2017  Dispense: 30 tablet; Refill: 0  Mort Smelser [name] 08/06/2015 4:44 PM

## 2015-09-03 ENCOUNTER — Ambulatory Visit: Payer: BLUE CROSS/BLUE SHIELD | Admitting: Family Medicine

## 2015-09-11 ENCOUNTER — Ambulatory Visit (INDEPENDENT_AMBULATORY_CARE_PROVIDER_SITE_OTHER): Payer: BLUE CROSS/BLUE SHIELD | Admitting: Family Medicine

## 2015-09-11 ENCOUNTER — Encounter: Payer: Self-pay | Admitting: Family Medicine

## 2015-09-11 VITALS — BP 138/74 | HR 76 | Temp 97.8°F | Resp 16 | Ht 66.0 in | Wt 173.3 lb

## 2015-09-11 DIAGNOSIS — G8929 Other chronic pain: Secondary | ICD-10-CM

## 2015-09-11 DIAGNOSIS — M7918 Myalgia, other site: Secondary | ICD-10-CM

## 2015-09-11 DIAGNOSIS — M791 Myalgia: Secondary | ICD-10-CM | POA: Diagnosis not present

## 2015-09-11 MED ORDER — OXYMORPHONE HCL ER 20 MG PO TB12: 20.0000 mg | ORAL_TABLET | Freq: Two times a day (BID) | ORAL | Status: DC

## 2015-09-11 MED ORDER — OXYMORPHONE HCL ER 5 MG PO TB12: 5.0000 mg | ORAL_TABLET | Freq: Every morning | ORAL | Status: DC

## 2015-09-11 NOTE — Progress Notes (Signed)
Name: Hector Gonzalez   MRN: [id number]    DOB: 28-Nov-1965   Date:09/11/2015       Progress Note  Subjective  Chief Complaint  Chief Complaint  Patient presents with  . Follow-up    1 mo  . Medication Refill    opana 20 mg / opana 5 mg    HPI  Myofascial Pain: Present for over 20 years, symptoms include lower back pain radiating down and to the front affecting the groin, lower abdeomen area. Pain is rated at 3/10. He takes Opana 25 in AM and 20 mg at night. He also takes Gabapentin, Zoloft, and Ibuprofen for relief. No side effects reported from opioid therapy.   Past Medical History  Diagnosis Date  . Nerve pain   . Hyperlipidemia     Past Surgical History  Procedure Laterality Date  . Ankle surgery    . Knee surgery      Family History  Problem Relation Age of Onset  . Cancer    . Diabetes    . Mental illness    . Cancer Mother     Social History   Social History  . Marital Status: Single    Spouse Name: N/A  . Number of Children: 1  . Years of Education: 12   Occupational History  .      [employer]   Social History Main Topics  . Smoking status: Never Smoker   . Smokeless tobacco: Never Used  . Alcohol Use: No  . Drug Use: No  . Sexual Activity: Not on file   Other Topics Concern  . Not on file   Social History Narrative   Patients lives at home with his wife [name] and works full time for [employer] school education   Right handed   Caffeine quit      Current outpatient prescriptions:  .  b complex vitamins capsule, Take 1 capsule by mouth daily., Disp: , Rfl:  .  fluticasone (FLONASE) 50 MCG/ACT nasal spray, Place 2 sprays into both nostrils daily., Disp: 16 g, Rfl: 0 .  gabapentin (NEURONTIN) 600 MG tablet, Take 600 mg by mouth daily., Disp: , Rfl:  .  ibuprofen (ADVIL,MOTRIN) 200 MG tablet, Take 200 mg by mouth every 6 (six) hours as needed., Disp: , Rfl:  .  oxymorphone (OPANA ER) 20 MG 12 hr tablet, Take 1 tablet (20 mg total)  by mouth every 12 (twelve) hours. Please disp on/after January 22nd 2017, Disp: 60 tablet, Rfl: 0 .  oxymorphone (OPANA ER) 5 MG 12 hr tablet, Take 1 tablet (5 mg total) by mouth every morning. Take with Opana ER 20 mg AM dose. Please fill on/after January 22nd, 2017, Disp: 30 tablet, Rfl: 0 .  sertraline (ZOLOFT) 100 MG tablet, TAKE ONE TABLET EVERY DAY, Disp: 30 tablet, Rfl: 2  No Known Allergies   Review of Systems  Musculoskeletal: Positive for myalgias, back pain and joint pain.      Objective  Filed Vitals:   09/11/15 1515  BP: 138/74  Pulse: 76  Temp: 97.8 F (36.6 C)  TempSrc: Oral  Resp: 16  Height:  (1.676 m)  Weight: 173 lb 4.8 oz (78.608 kg)  SpO2: 97%    Physical Exam  Constitutional: He is oriented to person, place, and time and well-developed, well-nourished, and in no distress.  Cardiovascular: Normal rate and regular rhythm.   Pulmonary/Chest: Effort normal and breath sounds normal.  Abdominal: Soft. Bowel  sounds are normal.  Musculoskeletal:       Lumbar back: He exhibits no tenderness, no pain and no spasm.  Neurological: He is alert and oriented to person, place, and time.  Nursing note and vitals reviewed.     Assessment & Plan  1. Chronic myofascial pain Compliant with controlled substances agreement. Taking medication as directed. Refills provided and follow-up in one month - oxymorphone (OPANA ER) 20 MG 12 hr tablet; Take 1 tablet (20 mg total) by mouth every 12 (twelve) hours.  Dispense: 60 tablet; Refill: 0 - oxymorphone (OPANA ER) 5 MG 12 hr tablet; Take 1 tablet (5 mg total) by mouth every morning. Take with Opana ER 20 mg AM dose.  Dispense: 30 tablet; Refill: 0  Shanty Ginty Asad [facility] 09/11/2015 3:41 PM

## 2015-10-03 ENCOUNTER — Encounter: Payer: Self-pay | Admitting: Family Medicine

## 2015-10-03 ENCOUNTER — Ambulatory Visit (INDEPENDENT_AMBULATORY_CARE_PROVIDER_SITE_OTHER): Payer: BLUE CROSS/BLUE SHIELD | Admitting: Family Medicine

## 2015-10-03 VITALS — BP 122/80 | HR 63 | Temp 97.7°F | Resp 16 | Ht 66.0 in | Wt 170.8 lb

## 2015-10-03 DIAGNOSIS — M791 Myalgia: Secondary | ICD-10-CM

## 2015-10-03 DIAGNOSIS — M7918 Myalgia, other site: Secondary | ICD-10-CM

## 2015-10-03 DIAGNOSIS — G8929 Other chronic pain: Secondary | ICD-10-CM

## 2015-10-03 MED ORDER — OXYMORPHONE HCL ER 20 MG PO TB12: 20.0000 mg | ORAL_TABLET | Freq: Two times a day (BID) | ORAL | Status: DC

## 2015-10-03 MED ORDER — OXYMORPHONE HCL ER 5 MG PO TB12: 5.0000 mg | ORAL_TABLET | Freq: Every morning | ORAL | Status: DC

## 2015-10-03 NOTE — Progress Notes (Signed)
Name: Hector Gonzalez   MRN: [id number]    DOB: [birth year]   Date:10/03/2015       Progress Note  Subjective  Chief Complaint  Chief Complaint  Patient presents with  . Follow-up    1 mo  . Medication Refill    HPI  Myofascial Pain: Present for over 20 years, symptoms include lower back pain radiating down and to the front affecting the groin, lower abdomen area. Pain is rated at 3/10. He takes Opana 25 in AM and 20 mg at night. He also takes Gabapentin, Zoloft, and Ibuprofen for relief. No side effects reported from opioid therapy.  Past Medical History  Diagnosis Date  . Nerve pain   . Hyperlipidemia     Past Surgical History  Procedure Laterality Date  . Ankle surgery    . Knee surgery      Family History  Problem Relation Age of Onset  . Cancer    . Diabetes    . Mental illness    . Cancer Mother     Social History   Social History  . Marital Status: Single    Spouse Name: N/A  . Number of Children: 1  . Years of Education: 12   Occupational History  .      [employer]   Social History Main Topics  . Smoking status: Never Smoker   . Smokeless tobacco: Never Used  . Alcohol Use: No  . Drug Use: No  . Sexual Activity: Not on file   Other Topics Concern  . Not on file   Social History Narrative   Patients lives at home with his wife [name] and works full time for [employer] school education   Right handed   Caffeine quit      Current outpatient prescriptions:  .  b complex vitamins capsule, Take 1 capsule by mouth daily., Disp: , Rfl:  .  fluticasone (FLONASE) 50 MCG/ACT nasal spray, Place 2 sprays into both nostrils daily., Disp: 16 g, Rfl: 0 .  gabapentin (NEURONTIN) 600 MG tablet, Take 600 mg by mouth daily., Disp: , Rfl:  .  ibuprofen (ADVIL,MOTRIN) 200 MG tablet, Take 200 mg by mouth every 6 (six) hours as needed., Disp: , Rfl:  .  oxymorphone (OPANA ER) 20 MG 12 hr tablet, Take 1 tablet (20 mg total) by mouth every 12 (twelve)  hours., Disp: 60 tablet, Rfl: 0 .  oxymorphone (OPANA ER) 5 MG 12 hr tablet, Take 1 tablet (5 mg total) by mouth every morning. Take with Opana ER 20 mg AM dose., Disp: 30 tablet, Rfl: 0 .  sertraline (ZOLOFT) 100 MG tablet, TAKE ONE TABLET EVERY DAY, Disp: 30 tablet, Rfl: 2  No Known Allergies   Review of Systems  Musculoskeletal: Positive for back pain.  Psychiatric/Behavioral: Negative for depression. The patient is not nervous/anxious.     Objective  Filed Vitals:   10/03/15 1553  BP: 122/80  Pulse: 63  Temp: 97.7 F (36.5 C)  TempSrc: Oral  Resp: 16  Height:  (1.676 m)  Weight: 170 lb 12.8 oz (77.474 kg)  SpO2: 98%    Physical Exam  Constitutional: He is oriented to person, place, and time and well-developed, well-nourished, and in no distress.  Cardiovascular: Normal rate and regular rhythm.   Pulmonary/Chest: Effort normal and breath sounds normal.  Abdominal: Soft. Bowel sounds are normal.  Musculoskeletal:       Lumbar back: He exhibits no tenderness, no  pain and no spasm.  Neurological: He is alert and oriented to person, place, and time.  Nursing note and vitals reviewed.     Assessment & Plan  1. Chronic myofascial pain Refills provided. As part of controlled substances agreement, we'll obtain urine drug screen. - oxymorphone (OPANA ER) 20 MG 12 hr tablet; Take 1 tablet (20 mg total) by mouth every 12 (twelve) hours. Please fill on/after October 13, 2015  Dispense: 60 tablet; Refill: 0 - oxymorphone (OPANA ER) 5 MG 12 hr tablet; Take 1 tablet (5 mg total) by mouth every morning. Take with Opana ER 20 mg AM dose.  Dispense: 30 tablet; Refill: 0 - Drug Screen 12+Alcohol+CRT, Ur   Fernanda Twaddell Asad A. Faylene [facility] 10/03/2015 4:03 PM

## 2015-10-04 DIAGNOSIS — M791 Myalgia: Secondary | ICD-10-CM | POA: Diagnosis not present

## 2015-10-04 DIAGNOSIS — G8929 Other chronic pain: Secondary | ICD-10-CM | POA: Diagnosis not present

## 2015-10-05 ENCOUNTER — Other Ambulatory Visit: Payer: Self-pay | Admitting: Family Medicine

## 2015-10-10 LAB — DRUG SCREEN 12+ALCOHOL+CRT, UR
Amphetamines, Urine: NEGATIVE ng/mL
BARBITURATE: NEGATIVE ng/mL
BENZODIAZ UR QL: NEGATIVE ng/mL
COCAINE (METABOLITE): NEGATIVE ng/mL
Cannabinoids: NEGATIVE ng/mL
Creatinine, Urine: 231.4 mg/dL (ref 20.0–300.0)
ETHANOL U, QUAN: NEGATIVE %
METHADONE: NEGATIVE ng/mL
Meperidine: NEGATIVE ng/mL
OPIATE SCREEN URINE: NEGATIVE ng/mL
OXYCODONE+OXYMORPHONE UR QL SCN: POSITIVE — AB
Phencyclidine: NEGATIVE ng/mL
Propoxyphene: NEGATIVE ng/mL
TRAMADOL: NEGATIVE ng/mL

## 2015-10-10 LAB — PLEASE NOTE

## 2015-10-26 DIAGNOSIS — H6982 Other specified disorders of Eustachian tube, left ear: Secondary | ICD-10-CM | POA: Diagnosis not present

## 2015-11-01 ENCOUNTER — Ambulatory Visit: Payer: BLUE CROSS/BLUE SHIELD | Admitting: Family Medicine

## 2015-11-08 ENCOUNTER — Ambulatory Visit (INDEPENDENT_AMBULATORY_CARE_PROVIDER_SITE_OTHER): Payer: BLUE CROSS/BLUE SHIELD | Admitting: Family Medicine

## 2015-11-08 ENCOUNTER — Encounter: Payer: Self-pay | Admitting: Family Medicine

## 2015-11-08 VITALS — BP 142/80 | HR 61 | Temp 97.5°F | Resp 16 | Ht 66.0 in | Wt 169.4 lb

## 2015-11-08 DIAGNOSIS — G8929 Other chronic pain: Secondary | ICD-10-CM

## 2015-11-08 DIAGNOSIS — M791 Myalgia: Secondary | ICD-10-CM | POA: Diagnosis not present

## 2015-11-08 DIAGNOSIS — M7918 Myalgia, other site: Secondary | ICD-10-CM

## 2015-11-08 MED ORDER — OXYMORPHONE HCL ER 20 MG PO TB12
20.0000 mg | ORAL_TABLET | Freq: Two times a day (BID) | ORAL | Status: DC
Start: 2015-11-08 — End: 2015-12-10

## 2015-11-08 MED ORDER — OXYMORPHONE HCL ER 5 MG PO TB12: 5.0000 mg | ORAL_TABLET | Freq: Every morning | ORAL | Status: DC

## 2015-11-08 NOTE — Progress Notes (Signed)
Name: Hector Gonzalez   MRN: [id number]    DOB: 1965-10-18   Date:11/08/2015       Progress Note  Subjective  Chief Complaint  Chief Complaint  Patient presents with  . Pain    medication refill    HPI  Myofascial Pain: Pt. Presents for medication refills and follow up on Myofascial pain. He has lower back pain radiating down into his thighs, pain rated at 2-3/10. Relieved with Opana 25 mg in AM and 20 mg at night.Urine drug screen obtained last month is consistent. Patient reports medication working to relieve his symptoms, no reported adverse effect   Past Medical History  Diagnosis Date  . Nerve pain   . Hyperlipidemia     Past Surgical History  Procedure Laterality Date  . Ankle surgery    . Knee surgery      Family History  Problem Relation Age of Onset  . Cancer    . Diabetes    . Mental illness    . Cancer Mother     Social History   Social History  . Marital Status: Single    Spouse Name: N/A  . Number of Children: 1  . Years of Education: 12   Occupational History  .      [employer]   Social History Main Topics  . Smoking status: Never Smoker   . Smokeless tobacco: Never Used  . Alcohol Use: No  . Drug Use: No  . Sexual Activity: Not on file   Other Topics Concern  . Not on file   Social History Narrative   Patients lives at home with his wife [name] and works full time for [employer] school education   Right handed   Caffeine quit      Current outpatient prescriptions:  .  b complex vitamins capsule, Take 1 capsule by mouth daily., Disp: , Rfl:  .  fluticasone (FLONASE) 50 MCG/ACT nasal spray, Place 2 sprays into both nostrils daily., Disp: 16 g, Rfl: 0 .  gabapentin (NEURONTIN) 600 MG tablet, Take 600 mg by mouth daily., Disp: , Rfl:  .  ibuprofen (ADVIL,MOTRIN) 200 MG tablet, Take 200 mg by mouth every 6 (six) hours as needed., Disp: , Rfl:  .  oxymorphone (OPANA ER) 20 MG 12 hr tablet, Take 1 tablet (20 mg total) by mouth  every 12 (twelve) hours. Please fill on/after October 13, 2015, Disp: 60 tablet, Rfl: 0 .  oxymorphone (OPANA ER) 5 MG 12 hr tablet, Take 1 tablet (5 mg total) by mouth every morning. Take with Opana ER 20 mg AM dose., Disp: 30 tablet, Rfl: 0 .  sertraline (ZOLOFT) 100 MG tablet, TAKE ONE TABLET EVERY DAY, Disp: 30 tablet, Rfl: 2  No Known Allergies   Review of Systems  Musculoskeletal: Positive for back pain.    Objective  Filed Vitals:   11/08/15 1510  BP: 142/80  Pulse: 61  Temp: 97.5 F (36.4 C)  TempSrc: Oral  Resp: 16  Height:  (1.676 m)  Weight: 169 lb 6.4 oz (76.839 kg)  SpO2: 97%    Physical Exam  Constitutional: He is well-developed, well-nourished, and in no distress.  Cardiovascular: Normal rate and regular rhythm.   Pulmonary/Chest: Effort normal and breath sounds normal.  Musculoskeletal:       Lumbar back: He exhibits no tenderness and no pain.  Psychiatric: Mood, memory, affect and judgment normal.  Nursing note and vitals reviewed.  Assessment & Plan  1. Chronic myofascial pain   - oxymorphone (OPANA ER) 20 MG 12 hr tablet; Take 1 tablet (20 mg total) by mouth every 12 (twelve) hours.  Dispense: 60 tablet; Refill: 0 - oxymorphone (OPANA ER) 5 MG 12 hr tablet; Take 1 tablet (5 mg total) by mouth every morning.  Dispense: 30 tablet; Refill: 0   Isiaah Cuervo Asad A. [facility]lene [facility] 11/08/2015 3:14 PM

## 2015-12-10 ENCOUNTER — Ambulatory Visit (INDEPENDENT_AMBULATORY_CARE_PROVIDER_SITE_OTHER): Payer: BLUE CROSS/BLUE SHIELD | Admitting: Family Medicine

## 2015-12-10 ENCOUNTER — Encounter: Payer: Self-pay | Admitting: Family Medicine

## 2015-12-10 VITALS — BP 139/72 | HR 65 | Temp 97.7°F | Resp 15 | Ht 66.0 in | Wt 173.9 lb

## 2015-12-10 DIAGNOSIS — G8929 Other chronic pain: Secondary | ICD-10-CM

## 2015-12-10 DIAGNOSIS — M7918 Myalgia, other site: Secondary | ICD-10-CM

## 2015-12-10 DIAGNOSIS — M791 Myalgia: Secondary | ICD-10-CM

## 2015-12-10 MED ORDER — OXYMORPHONE HCL ER 20 MG PO TB12: 20.0000 mg | ORAL_TABLET | Freq: Two times a day (BID) | ORAL | Status: DC

## 2015-12-10 MED ORDER — OXYMORPHONE HCL ER 5 MG PO TB12: 5.0000 mg | ORAL_TABLET | Freq: Every morning | ORAL | Status: DC

## 2015-12-10 NOTE — Progress Notes (Signed)
Name: Hector Gonzalez   MRN: [id number]    DOB: 10/07/1965   Date:12/10/2015       Progress Note  Subjective  Chief Complaint  Chief Complaint  Patient presents with  . Follow-up    1 mo  . Medication Refill    Opana 5 mg /20 mg     HPI  Myofascial Pain: Pt. Presents for medication refills and follow up on Myofascial pain. He has lower back pain radiating down into his thighs, pain rated at 3/10. Relieved with Opana 25 mg in AM and 20 mg at night.Urine drug screen obtained last month is consistent. Patient reports medication working to relieve his symptoms, no reported adverse effects.  Past Medical History  Diagnosis Date  . Nerve pain   . Hyperlipidemia     Past Surgical History  Procedure Laterality Date  . Ankle surgery    . Knee surgery      Family History  Problem Relation Age of Onset  . Cancer    . Diabetes    . Mental illness    . Cancer Mother     Social History   Social History  . Marital Status: Single    Spouse Name: N/A  . Number of Children: 1  . Years of Education: 12   Occupational History  .      [employer]   Social History Main Topics  . Smoking status: Never Smoker   . Smokeless tobacco: Never Used  . Alcohol Use: No  . Drug Use: No  . Sexual Activity: Not on file   Other Topics Concern  . Not on file   Social History Narrative   Patients lives at home with his wife Hector FifeLynn and works full time for [employer]   High school education   Right handed   Caffeine quit      Current outpatient prescriptions:  .  b complex vitamins capsule, Take 1 capsule by mouth daily., Disp: , Rfl:  .  fluticasone (FLONASE) 50 MCG/ACT nasal spray, Place 2 sprays into both nostrils daily., Disp: 16 g, Rfl: 0 .  gabapentin (NEURONTIN) 600 MG tablet, Take 600 mg by mouth daily., Disp: , Rfl:  .  ibuprofen (ADVIL,MOTRIN) 200 MG tablet, Take 200 mg by mouth every 6 (six) hours as needed., Disp: , Rfl:  .  oxymorphone (OPANA ER) 20 MG 12 hr tablet, Take  1 tablet (20 mg total) by mouth every 12 (twelve) hours., Disp: 60 tablet, Rfl: 0 .  oxymorphone (OPANA ER) 5 MG 12 hr tablet, Take 1 tablet (5 mg total) by mouth every morning., Disp: 30 tablet, Rfl: 0 .  sertraline (ZOLOFT) 100 MG tablet, TAKE ONE TABLET EVERY DAY, Disp: 30 tablet, Rfl: 2  No Known Allergies   Review of Systems  Musculoskeletal: Positive for back pain. Negative for joint pain.     Objective  Filed Vitals:   12/10/15 1515  BP: 139/72  Pulse: 65  Temp: 97.7 F (36.5 C)  TempSrc: Oral  Resp: 15  Height: 5\' 6"  (1.676 m)  Weight: 173 lb 14.4 oz (78.881 kg)  SpO2: 97%    Physical Exam  Constitutional: He is well-developed, well-nourished, and in no distress.  Cardiovascular: Normal rate and regular rhythm.   Pulmonary/Chest: Effort normal and breath sounds normal.  Musculoskeletal:       Lumbar back: He exhibits no tenderness and no pain.  Psychiatric: Mood, memory, affect and judgment normal.  Nursing note and vitals reviewed.  Assessment & Plan  1. Chronic myofascial pain Stable, responsive to opioid therapy. UDS consistent. Refills provided and follow-up in one month - oxymorphone (OPANA ER) 20 MG 12 hr tablet; Take 1 tablet (20 mg total) by mouth every 12 (twelve) hours.  Dispense: 60 tablet; Refill: 0 - oxymorphone (OPANA ER) 5 MG 12 hr tablet; Take 1 tablet (5 mg total) by mouth every morning.  Dispense: 30 tablet; Refill: 0    Ger Ringenberg [name] 12/10/2015 3:35 PM

## 2016-01-04 ENCOUNTER — Other Ambulatory Visit: Payer: Self-pay | Admitting: Family Medicine

## 2016-01-09 ENCOUNTER — Ambulatory Visit: Payer: BLUE CROSS/BLUE SHIELD | Admitting: Family Medicine

## 2016-01-10 ENCOUNTER — Telehealth: Payer: Self-pay | Admitting: Family Medicine

## 2016-01-10 DIAGNOSIS — M7918 Myalgia, other site: Secondary | ICD-10-CM

## 2016-01-10 DIAGNOSIS — G8929 Other chronic pain: Secondary | ICD-10-CM

## 2016-01-10 MED ORDER — OXYMORPHONE HCL ER 20 MG PO TB12: 20.0000 mg | ORAL_TABLET | Freq: Two times a day (BID) | ORAL | Status: DC

## 2016-01-10 MED ORDER — OXYMORPHONE HCL ER 5 MG PO TB12: 5.0000 mg | ORAL_TABLET | Freq: Every morning | ORAL | Status: DC

## 2016-01-10 NOTE — Telephone Encounter (Signed)
Prescription for Opana is ready for pickup
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
## 2016-01-11 NOTE — Telephone Encounter (Signed)
Pt informed

## 2016-01-14 ENCOUNTER — Other Ambulatory Visit: Payer: Self-pay | Admitting: Family Medicine

## 2016-01-16 ENCOUNTER — Ambulatory Visit (INDEPENDENT_AMBULATORY_CARE_PROVIDER_SITE_OTHER): Payer: BLUE CROSS/BLUE SHIELD | Admitting: Family Medicine

## 2016-01-16 ENCOUNTER — Encounter: Payer: Self-pay | Admitting: Family Medicine

## 2016-01-16 DIAGNOSIS — G8929 Other chronic pain: Secondary | ICD-10-CM | POA: Diagnosis not present

## 2016-01-16 DIAGNOSIS — M7918 Myalgia, other site: Secondary | ICD-10-CM

## 2016-01-16 DIAGNOSIS — M791 Myalgia: Secondary | ICD-10-CM | POA: Diagnosis not present

## 2016-01-16 MED ORDER — GABAPENTIN 600 MG PO TABS: 600.0000 mg | ORAL_TABLET | Freq: Every day | ORAL | 0 refills | Status: DC

## 2016-01-16 MED ORDER — OXYMORPHONE HCL ER 5 MG PO TB12: 5.0000 mg | ORAL_TABLET | Freq: Every morning | ORAL | 0 refills | Status: DC

## 2016-01-16 MED ORDER — OXYMORPHONE HCL ER 20 MG PO TB12: 20.0000 mg | ORAL_TABLET | Freq: Two times a day (BID) | ORAL | 0 refills | Status: DC

## 2016-01-16 NOTE — Progress Notes (Signed)
Name: Hector Gonzalez   MRN: [id number]    DOB: 1965-11-24   Date:01/16/2016       Progress Note  Subjective  Chief Complaint  Chief Complaint  Patient presents with  . Pain    med refill    HPI  Myofascial Pain: Pt. Presents for medication refills and follow up on Myofascial pain. He has lower back pain radiating down into his thighs, pain rated at 3/10. Relieved with Opana 25 mg in AM and 20 mg at night.Urine drug screen obtained last month is consistent. Patient reports medication working to relieve his symptoms, no reported adverse effects.  Past Medical History:  Diagnosis Date  . Hyperlipidemia   . Nerve pain     Past Surgical History:  Procedure Laterality Date  . ANKLE SURGERY    . KNEE SURGERY      Family History  Problem Relation Age of Onset  . Cancer    . Diabetes    . Mental illness    . Cancer Mother     Social History   Social History  . Marital status: Single    Spouse name: N/A  . Number of children: 1  . Years of education: 62   Occupational History  .      [employer]   Social History Main Topics  . Smoking status: Never Smoker  . Smokeless tobacco: Never Used  . Alcohol use No  . Drug use: No  . Sexual activity: Not on file   Other Topics Concern  . Not on file   Social History Narrative   Patients lives at home with his wife [name] and works full time for [employer] school education   Right handed   Caffeine quit      Current Outpatient Prescriptions:  .  b complex vitamins capsule, Take 1 capsule by mouth daily., Disp: , Rfl:  .  fluticasone (FLONASE) 50 MCG/ACT nasal spray, Place 2 sprays into both nostrils daily., Disp: 16 g, Rfl: 0 .  gabapentin (NEURONTIN) 600 MG tablet, Take 600 mg by mouth daily., Disp: , Rfl:  .  ibuprofen (ADVIL,MOTRIN) 200 MG tablet, Take 200 mg by mouth every 6 (six) hours as needed., Disp: , Rfl:  .  oxymorphone (OPANA ER) 20 MG 12 hr tablet, Take 1 tablet (20 mg total) by mouth every 12  (twelve) hours., Disp: 14 tablet, Rfl: 0 .  sertraline (ZOLOFT) 100 MG tablet, TAKE ONE TABLET BY MOUTH EVERY DAY, Disp: 30 tablet, Rfl: 2 .  oxymorphone (OPANA ER) 5 MG 12 hr tablet, Take 1 tablet (5 mg total) by mouth every morning., Disp: 7 tablet, Rfl: 0  No Known Allergies   Review of Systems  Constitutional: Negative for chills, fever, malaise/fatigue and weight loss.  Gastrointestinal: Negative for abdominal pain.  Musculoskeletal: Positive for back pain and joint pain.  Neurological: Negative for dizziness.      Objective  Vitals:   01/16/16 1553  BP: 124/70  Pulse: 66  Resp: 16  Temp: 98 F (36.7 C)  TempSrc: Oral  SpO2: 96%  Weight: 170 lb 8 oz (77.3 kg)  Height: 5\' 6"  (1.676 m)    Physical Exam  Constitutional: He is oriented to person, place, and time and well-developed, well-nourished, and in no distress.  HENT:  Head: Normocephalic and atraumatic.  Cardiovascular: Normal rate, regular rhythm and normal heart sounds.   No murmur heard. Pulmonary/Chest: Effort normal and breath sounds normal.  Abdominal: Soft. Bowel  sounds are normal.  Neurological: He is alert and oriented to person, place, and time.  Psychiatric: Mood, memory, affect and judgment normal.  Nursing note and vitals reviewed.   Assessment & Plan  1. Chronic myofascial pain Stable, responsive to chronic opioid therapy, UDS consistent. Refills provided and follow-up in one month - oxymorphone (OPANA ER) 20 MG 12 hr tablet; Take 1 tablet (20 mg total) by mouth every 12 (twelve) hours.  Dispense: 60 tablet; Refill: 0 - oxymorphone (OPANA ER) 5 MG 12 hr tablet; Take 1 tablet (5 mg total) by mouth every morning.  Dispense: 30 tablet; Refill: 0 - gabapentin (NEURONTIN) 600 MG tablet; Take 1 tablet (600 mg total) by mouth daily.  Dispense: 90 tablet; Refill: 0   Raihan Kimmel [name] 01/16/2016 4:04 PM

## 2016-02-14 ENCOUNTER — Ambulatory Visit: Payer: BLUE CROSS/BLUE SHIELD | Admitting: Family Medicine

## 2016-02-18 ENCOUNTER — Encounter: Payer: Self-pay | Admitting: Family Medicine

## 2016-02-18 ENCOUNTER — Ambulatory Visit (INDEPENDENT_AMBULATORY_CARE_PROVIDER_SITE_OTHER): Payer: BLUE CROSS/BLUE SHIELD | Admitting: Family Medicine

## 2016-02-18 DIAGNOSIS — M791 Myalgia: Secondary | ICD-10-CM

## 2016-02-18 DIAGNOSIS — G8929 Other chronic pain: Secondary | ICD-10-CM

## 2016-02-18 DIAGNOSIS — M7918 Myalgia, other site: Secondary | ICD-10-CM

## 2016-02-18 MED ORDER — OXYMORPHONE HCL ER 20 MG PO TB12: 20.0000 mg | ORAL_TABLET | Freq: Two times a day (BID) | ORAL | 0 refills | Status: DC

## 2016-02-18 MED ORDER — OXYMORPHONE HCL ER 5 MG PO TB12: 5.0000 mg | ORAL_TABLET | Freq: Every morning | ORAL | 0 refills | Status: DC

## 2016-02-18 NOTE — Progress Notes (Signed)
Name: Hector Gonzalez   MRN: [id number]    DOB: 06/03/1966   Date:02/18/2016       Progress Note  Subjective  Chief Complaint  Chief Complaint  Patient presents with  . Follow-up    1 mo  . Medication Refill    HPI  Myofascial Pain: Pt. Presents for medication refills and follow up on Myofascial pain. He has lower back pain radiating down into his thighs, pain rated at 3/10. Relieved with Opana 25 mg in AM and 20 mg at night. Patient reports medication working to relieve his symptoms, no reported adverse effects.  Past Medical History:  Diagnosis Date  . Hyperlipidemia   . Nerve pain     Past Surgical History:  Procedure Laterality Date  . ANKLE SURGERY    . KNEE SURGERY      Family History  Problem Relation Age of Onset  . Cancer Mother   . Cancer    . Diabetes    . Mental illness      Social History   Social History  . Marital status: Single    Spouse name: N/A  . Number of children: 1  . Years of education: 60   Occupational History  .      [employer]   Social History Main Topics  . Smoking status: Never Smoker  . Smokeless tobacco: Never Used  . Alcohol use No  . Drug use: No  . Sexual activity: Not on file   Other Topics Concern  . Not on file   Social History Narrative   Patients lives at home with his wife [name] and works full time for [employer] school education   Right handed   Caffeine quit      Current Outpatient Prescriptions:  .  b complex vitamins capsule, Take 1 capsule by mouth daily., Disp: , Rfl:  .  fluticasone (FLONASE) 50 MCG/ACT nasal spray, Place 2 sprays into both nostrils daily., Disp: 16 g, Rfl: 0 .  gabapentin (NEURONTIN) 600 MG tablet, Take 1 tablet (600 mg total) by mouth daily., Disp: 90 tablet, Rfl: 0 .  ibuprofen (ADVIL,MOTRIN) 200 MG tablet, Take 200 mg by mouth every 6 (six) hours as needed., Disp: , Rfl:  .  oxymorphone (OPANA ER) 20 MG 12 hr tablet, Take 1 tablet (20 mg total) by mouth every 12  (twelve) hours., Disp: 60 tablet, Rfl: 0 .  oxymorphone (OPANA ER) 5 MG 12 hr tablet, Take 1 tablet (5 mg total) by mouth every morning., Disp: 30 tablet, Rfl: 0 .  sertraline (ZOLOFT) 100 MG tablet, TAKE ONE TABLET BY MOUTH EVERY DAY, Disp: 30 tablet, Rfl: 2  No Known Allergies   Review of Systems  Constitutional: Negative for chills, fever and malaise/fatigue.  Musculoskeletal: Positive for back pain.  Psychiatric/Behavioral: Negative for depression. The patient is not nervous/anxious.     Objective  Vitals:   02/18/16 1619  BP: 120/76  Pulse: 69  Resp: 15  Temp: 97.8 F (36.6 C)  TempSrc: Oral  SpO2: 96%  Weight: 171 lb (77.6 kg)  Height: 5\' 6"  (1.676 m)    Physical Exam  Constitutional: He is oriented to person, place, and time and well-developed, well-nourished, and in no distress.  HENT:  Head: Normocephalic and atraumatic.  Cardiovascular: Normal rate, regular rhythm and normal heart sounds.   No murmur heard. Pulmonary/Chest: Effort normal and breath sounds normal.  Abdominal: Soft. Bowel sounds are normal.  Neurological: He is alert  and oriented to person, place, and time.  Psychiatric: Mood, memory, affect and judgment normal.  Nursing note and vitals reviewed.     Assessment & Plan  1. Chronic myofascial pain Stable, responsive to opioid therapy, patient compliant with controlled substances agreement. Refills provided and follow-up in one month - oxymorphone (OPANA ER) 20 MG 12 hr tablet; Take 1 tablet (20 mg total) by mouth every 12 (twelve) hours.  Dispense: 60 tablet; Refill: 0 - oxymorphone (OPANA ER) 5 MG 12 hr tablet; Take 1 tablet (5 mg total) by mouth every morning.  Dispense: 30 tablet; Refill: 0   Justin Meisenheimer Asad A. [facility]lene [facility] 02/18/2016 4:25 PM

## 2016-02-18 NOTE — Progress Notes (Signed)
foll

## 2016-03-24 ENCOUNTER — Encounter: Payer: Self-pay | Admitting: Family Medicine

## 2016-03-24 ENCOUNTER — Ambulatory Visit (INDEPENDENT_AMBULATORY_CARE_PROVIDER_SITE_OTHER): Payer: BLUE CROSS/BLUE SHIELD | Admitting: Family Medicine

## 2016-03-24 DIAGNOSIS — G8929 Other chronic pain: Secondary | ICD-10-CM

## 2016-03-24 DIAGNOSIS — M791 Myalgia: Secondary | ICD-10-CM | POA: Diagnosis not present

## 2016-03-24 DIAGNOSIS — M7918 Myalgia, other site: Secondary | ICD-10-CM

## 2016-03-24 MED ORDER — OXYMORPHONE HCL ER 20 MG PO TB12: 20.0000 mg | ORAL_TABLET | Freq: Two times a day (BID) | ORAL | 0 refills | Status: DC

## 2016-03-24 MED ORDER — OXYMORPHONE HCL ER 5 MG PO TB12: 5.0000 mg | ORAL_TABLET | Freq: Every morning | ORAL | 0 refills | Status: DC

## 2016-03-24 NOTE — Progress Notes (Signed)
Name: Hector Gonzalez   MRN: [id number]    DOB: 07/15/1965   Date:03/24/2016       Progress Note  Subjective  Chief Complaint  Chief Complaint  Patient presents with  . Back Pain    medication refills    HPI  Myofascial Pain:Pt. Presents for medication refills and follow up on Myofascial pain. Hector Gonzalez has lower back pain radiating down into his thighs, pain rated at 3/10. Relieved with Opana 25 mg in AM and 20 mg at night. Patient reports medication working to relieve his symptoms, no reported adverse effects.  Past Medical History:  Diagnosis Date  . Hyperlipidemia   . Nerve pain     Past Surgical History:  Procedure Laterality Date  . ANKLE SURGERY    . KNEE SURGERY      Family History  Problem Relation Age of Onset  . Cancer Mother   . Cancer    . Diabetes    . Mental illness      Social History   Social History  . Marital status: Single    Spouse name: N/A  . Number of children: 1  . Years of education: 3312   Occupational History  .      [employer]   Social History Main Topics  . Smoking status: Never Smoker  . Smokeless tobacco: Never Used  . Alcohol use No  . Drug use: No  . Sexual activity: Not on file   Other Topics Concern  . Not on file   Social History Narrative   Patients lives at home with his wife [name] and works full time for [employer]   High school education   Right handed   Caffeine quit      Current Outpatient Prescriptions:  .  b complex vitamins capsule, Take 1 capsule by mouth daily., Disp: , Rfl:  .  fluticasone (FLONASE) 50 MCG/ACT nasal spray, Place 2 sprays into both nostrils daily., Disp: 16 g, Rfl: 0 .  gabapentin (NEURONTIN) 600 MG tablet, Take 1 tablet (600 mg total) by mouth daily., Disp: 90 tablet, Rfl: 0 .  ibuprofen (ADVIL,MOTRIN) 200 MG tablet, Take 200 mg by mouth every 6 (six) hours as needed., Disp: , Rfl:  .  oxymorphone (OPANA ER) 20 MG 12 hr tablet, Take 1 tablet (20 mg total) by mouth every 12 (twelve)  hours., Disp: 60 tablet, Rfl: 0 .  sertraline (ZOLOFT) 100 MG tablet, TAKE ONE TABLET BY MOUTH EVERY DAY, Disp: 30 tablet, Rfl: 2 .  oxymorphone (OPANA ER) 5 MG 12 hr tablet, Take 1 tablet (5 mg total) by mouth every morning., Disp: 30 tablet, Rfl: 0  No Known Allergies   Review of Systems  Constitutional: Negative for chills, fever, malaise/fatigue and weight loss.  Cardiovascular: Negative for chest pain.  Gastrointestinal: Negative for abdominal pain.  Musculoskeletal: Positive for back pain.    Objective  Vitals:   03/24/16 1412  BP: 122/84  Pulse: 66  Resp: 18  Temp: 98.1 F (36.7 C)  TempSrc: Oral  SpO2: 97%  Weight: 171 lb 9.6 oz (77.8 kg)  Height: 5\' 6"  (1.676 m)    Physical Exam  Constitutional: Hector Gonzalez is oriented to person, place, and time and well-developed, well-nourished, and in no distress.  HENT:  Head: Normocephalic and atraumatic.  Cardiovascular: Normal rate, regular rhythm and normal heart sounds.   No murmur heard. Pulmonary/Chest: Effort normal and breath sounds normal.  Abdominal: Soft. Bowel sounds are normal.  Neurological: Hector Gonzalez is  alert and oriented to person, place, and time.  Psychiatric: Mood, memory, affect and judgment normal.  Nursing note and vitals reviewed.   Assessment & Plan  1. Chronic myofascial pain Stable, continue present therapy. Patient compliant with controlled substances agreement.  - oxymorphone (OPANA ER) 20 MG 12 hr tablet; Take 1 tablet (20 mg total) by mouth every 12 (twelve) hours.  Dispense: 60 tablet; Refill: 0 - oxymorphone (OPANA ER) 5 MG 12 hr tablet; Take 1 tablet (5 mg total) by mouth every morning.  Dispense: 30 tablet; Refill: 0   Ader Fritze [name] 03/24/2016 2:26 PM

## 2016-04-08 DIAGNOSIS — M7631 Iliotibial band syndrome, right leg: Secondary | ICD-10-CM | POA: Diagnosis not present

## 2016-04-09 ENCOUNTER — Other Ambulatory Visit: Payer: Self-pay | Admitting: Family Medicine

## 2016-04-18 ENCOUNTER — Other Ambulatory Visit: Payer: Self-pay | Admitting: Family Medicine

## 2016-04-18 DIAGNOSIS — G8929 Other chronic pain: Secondary | ICD-10-CM

## 2016-04-18 DIAGNOSIS — M7918 Myalgia, other site: Secondary | ICD-10-CM

## 2016-04-21 ENCOUNTER — Encounter: Payer: Self-pay | Admitting: Family Medicine

## 2016-04-21 ENCOUNTER — Ambulatory Visit (INDEPENDENT_AMBULATORY_CARE_PROVIDER_SITE_OTHER): Payer: BLUE CROSS/BLUE SHIELD | Admitting: Family Medicine

## 2016-04-21 DIAGNOSIS — M7918 Myalgia, other site: Secondary | ICD-10-CM

## 2016-04-21 DIAGNOSIS — G8929 Other chronic pain: Secondary | ICD-10-CM | POA: Diagnosis not present

## 2016-04-21 DIAGNOSIS — M791 Myalgia: Secondary | ICD-10-CM

## 2016-04-21 MED ORDER — OXYMORPHONE HCL ER 5 MG PO TB12: 5.0000 mg | ORAL_TABLET | Freq: Every morning | ORAL | 0 refills | Status: DC

## 2016-04-21 MED ORDER — GABAPENTIN 600 MG PO TABS: 600.0000 mg | ORAL_TABLET | Freq: Every day | ORAL | 0 refills | Status: DC

## 2016-04-21 MED ORDER — OXYMORPHONE HCL ER 20 MG PO TB12: 20.0000 mg | ORAL_TABLET | Freq: Two times a day (BID) | ORAL | 0 refills | Status: DC

## 2016-04-21 NOTE — Progress Notes (Signed)
Name: Hector Gonzalez   MRN: [id number]    DOB: 01/18/1966   Date:04/21/2016       Progress Note  Subjective  Chief Complaint  Chief Complaint  Patient presents with  . chronic myofascial pain    1 month follow up    HPI  Myofascial Pain: Pt. Presents for medication refills and follow up on Myofascial pain. He has lower back pain radiating down into his thighs, pain rated at 3/10. Relieved with Opana 25 mg in AM and 20 mg at night. Patient reports medication working to relieve his symptoms, no reported adverse effects. He is also requesting refill on Gabapentin 600 mg daily.   Past Medical History:  Diagnosis Date  . Hyperlipidemia   . Nerve pain     Past Surgical History:  Procedure Laterality Date  . ANKLE SURGERY    . KNEE SURGERY      Family History  Problem Relation Age of Onset  . Cancer Mother   . Cancer    . Diabetes    . Mental illness      Social History   Social History  . Marital status: Single    Spouse name: N/A  . Number of children: 1  . Years of education: 4512   Occupational History  .      [employer]   Social History Main Topics  . Smoking status: Never Smoker  . Smokeless tobacco: Never Used  . Alcohol use No  . Drug use: No  . Sexual activity: Not on file   Other Topics Concern  . Not on file   Social History Narrative   Patients lives at home with his wife Hector FifeLynn and works full time for [employer]ector Gonzalez   High school education   Right handed   Caffeine quit      Current Outpatient Prescriptions:  .  b complex vitamins capsule, Take 1 capsule by mouth daily., Disp: , Rfl:  .  fluticasone (FLONASE) 50 MCG/ACT nasal spray, Place 2 sprays into both nostrils daily., Disp: 16 g, Rfl: 0 .  gabapentin (NEURONTIN) 600 MG tablet, Take 1 tablet (600 mg total) by mouth daily., Disp: 90 tablet, Rfl: 0 .  ibuprofen (ADVIL,MOTRIN) 200 MG tablet, Take 200 mg by mouth every 6 (six) hours as needed., Disp: , Rfl:  .  oxymorphone (OPANA ER) 20 MG  12 hr tablet, Take 1 tablet (20 mg total) by mouth every 12 (twelve) hours., Disp: 60 tablet, Rfl: 0 .  oxymorphone (OPANA ER) 5 MG 12 hr tablet, Take 1 tablet (5 mg total) by mouth every morning., Disp: 30 tablet, Rfl: 0 .  sertraline (ZOLOFT) 100 MG tablet, TAKE ONE TABLET EVERY DAY, Disp: 90 tablet, Rfl: 0  No Known Allergies   ROS  Please see HPI  Objective  Vitals:   04/21/16 1600  BP: 122/82  Pulse: 73  Resp: 16  Temp: 98.3 F (36.8 C)  TempSrc: Oral  SpO2: 97%  Weight: 170 lb 2 oz (77.2 kg)  Height: 5\' 6"  (1.676 m)    Physical Exam  Constitutional: He is oriented to person, place, and time and well-developed, well-nourished, and in no distress.  HENT:  Head: Normocephalic and atraumatic.  Cardiovascular: Normal rate, regular rhythm and normal heart sounds.   No murmur heard. Pulmonary/Chest: Effort normal and breath sounds normal.  Abdominal: Soft. Bowel sounds are normal.  Neurological: He is alert and oriented to person, place, and time.  Psychiatric: Mood, memory, affect and judgment  normal.  Nursing note and vitals reviewed.      Assessment & Plan  1. Chronic myofascial pain Stable and responsive to opioid therapy and gabapentin. Patient compliant with controlled substances agreement. Refills provided - oxymorphone (OPANA ER) 5 MG 12 hr tablet; Take 1 tablet (5 mg total) by mouth every morning.  Dispense: 30 tablet; Refill: 0 - oxymorphone (OPANA ER) 20 MG 12 hr tablet; Take 1 tablet (20 mg total) by mouth every 12 (twelve) hours.  Dispense: 60 tablet; Refill: 0 - gabapentin (NEURONTIN) 600 MG tablet; Take 1 tablet (600 mg total) by mouth daily.  Dispense: 90 tablet; Refill: 0   Hector Gonzalez Hector Gonzalez [facility] 04/21/2016 4:24 PM

## 2016-05-05 ENCOUNTER — Encounter: Payer: BLUE CROSS/BLUE SHIELD | Admitting: Family Medicine

## 2016-05-07 ENCOUNTER — Other Ambulatory Visit: Payer: Self-pay | Admitting: Family Medicine

## 2016-05-27 ENCOUNTER — Encounter: Payer: Self-pay | Admitting: Family Medicine

## 2016-05-27 ENCOUNTER — Ambulatory Visit (INDEPENDENT_AMBULATORY_CARE_PROVIDER_SITE_OTHER): Payer: BLUE CROSS/BLUE SHIELD | Admitting: Family Medicine

## 2016-05-27 DIAGNOSIS — G8929 Other chronic pain: Secondary | ICD-10-CM | POA: Diagnosis not present

## 2016-05-27 DIAGNOSIS — M791 Myalgia: Secondary | ICD-10-CM | POA: Diagnosis not present

## 2016-05-27 DIAGNOSIS — M7918 Myalgia, other site: Secondary | ICD-10-CM

## 2016-05-27 MED ORDER — OXYMORPHONE HCL ER 5 MG PO TB12: 5.0000 mg | ORAL_TABLET | Freq: Every morning | ORAL | 0 refills | Status: DC

## 2016-05-27 MED ORDER — OXYMORPHONE HCL ER 20 MG PO TB12: 20.0000 mg | ORAL_TABLET | Freq: Two times a day (BID) | ORAL | 0 refills | Status: DC

## 2016-05-27 NOTE — Progress Notes (Signed)
Name: Hector Gonzalez   MRN: [id number]    DOB: 07/25/1965   Date:05/27/2016       Progress Note  Subjective  Chief Complaint  Chief Complaint  Patient presents with  . Medication Refill    opana    HPI  Myofascial Pain: Pt. Presents for medication refills and follow up on Myofascial pain. Hector Gonzalez has lower back pain radiating down into Hector Gonzalez thighs, pain rated at 3/10. Relieved with Opana 25 mg in AM and 20 mg at night. Patient reports medication working to relieve Hector Gonzalez symptoms, no reported adverse effects.     Past Medical History:  Diagnosis Date  . Hyperlipidemia   . Nerve pain     Past Surgical History:  Procedure Laterality Date  . ANKLE SURGERY    . KNEE SURGERY      Family History  Problem Relation Age of Onset  . Cancer Mother   . Cancer    . Diabetes    . Mental illness      Social History   Social History  . Marital status: Single    Spouse name: N/A  . Number of children: 1  . Years of education: 7412   Occupational History  .      [employer]   Social History Main Topics  . Smoking status: Never Smoker  . Smokeless tobacco: Never Used  . Alcohol use No  . Drug use: No  . Sexual activity: Not on file   Other Topics Concern  . Not on file   Social History Narrative   Hector Gonzalez lives at home with Hector Gonzalez and works full time for [employer]   High school education   Right handed   Caffeine quit      Current Outpatient Prescriptions:  .  b complex vitamins capsule, Take 1 capsule by mouth daily., Disp: , Rfl:  .  fluticasone (FLONASE) 50 MCG/ACT nasal spray, Place 2 sprays into both nostrils daily., Disp: 16 g, Rfl: 0 .  gabapentin (NEURONTIN) 600 MG tablet, Take 1 tablet (600 mg total) by mouth daily., Disp: 90 tablet, Rfl: 0 .  ibuprofen (ADVIL,MOTRIN) 200 MG tablet, Take 200 mg by mouth every 6 (six) hours as needed., Disp: , Rfl:  .  oxymorphone (OPANA ER) 20 MG 12 hr tablet, Take 1 tablet (20 mg total) by mouth every 12 (twelve)  hours., Disp: 60 tablet, Rfl: 0 .  oxymorphone (OPANA ER) 5 MG 12 hr tablet, Take 1 tablet (5 mg total) by mouth every morning., Disp: 30 tablet, Rfl: 0 .  sertraline (ZOLOFT) 100 MG tablet, TAKE ONE TABLET EVERY DAY, Disp: 90 tablet, Rfl: 0  No Known Allergies   ROS  Please see history of present illness for complete discussion of review of systems.  Objective  Vitals:   05/27/16 1543  BP: 118/75  Pulse: 67  Resp: 15  Temp: 97.8 F (36.6 C)  TempSrc: Oral  SpO2: 96%  Weight: 174 lb 12.8 oz (79.3 kg)  Height: 5\' 6"  (1.676 m)    Physical Exam  Constitutional: Hector Gonzalez is oriented to person, place, and time and well-developed, well-nourished, and in no distress.  HENT:  Head: Normocephalic and atraumatic.  Cardiovascular: Normal rate, regular rhythm and normal heart sounds.   No murmur heard. Pulmonary/Chest: Effort normal and breath sounds normal. Hector Gonzalez has no wheezes.  Neurological: Hector Gonzalez is alert and oriented to person, place, and time.  Psychiatric: Mood, memory, affect and judgment normal.  Nursing note and  vitals reviewed.     Assessment & Plan  1. Chronic myofascial pain Stable, responsive to Opana as prescribed. Patient compliant with controlled substances agreement and understands the dependence potential, side effects and drug interactions of opioids. Refills provided and follow-up in one month - oxymorphone (OPANA ER) 5 MG 12 hr tablet; Take 1 tablet (5 mg total) by mouth every morning.  Dispense: 30 tablet; Refill: 0 - oxymorphone (OPANA ER) 20 MG 12 hr tablet; Take 1 tablet (20 mg total) by mouth every 12 (twelve) hours.  Dispense: 60 tablet; Refill: 0   Hector Gonzalez [facility] 05/27/2016 4:17 PM

## 2016-06-26 ENCOUNTER — Other Ambulatory Visit: Payer: Self-pay | Admitting: Emergency Medicine

## 2016-06-26 ENCOUNTER — Ambulatory Visit: Payer: BLUE CROSS/BLUE SHIELD | Admitting: Family Medicine

## 2016-06-26 DIAGNOSIS — M7918 Myalgia, other site: Secondary | ICD-10-CM

## 2016-06-26 DIAGNOSIS — G8929 Other chronic pain: Secondary | ICD-10-CM

## 2016-06-26 MED ORDER — OXYMORPHONE HCL ER 20 MG PO TB12: 20.0000 mg | ORAL_TABLET | Freq: Two times a day (BID) | ORAL | 0 refills | Status: DC

## 2016-07-07 DIAGNOSIS — M1711 Unilateral primary osteoarthritis, right knee: Secondary | ICD-10-CM | POA: Diagnosis not present

## 2016-07-15 ENCOUNTER — Encounter: Payer: Self-pay | Admitting: Family Medicine

## 2016-07-15 ENCOUNTER — Ambulatory Visit (INDEPENDENT_AMBULATORY_CARE_PROVIDER_SITE_OTHER): Payer: BLUE CROSS/BLUE SHIELD | Admitting: Family Medicine

## 2016-07-15 DIAGNOSIS — M791 Myalgia: Secondary | ICD-10-CM

## 2016-07-15 DIAGNOSIS — G8929 Other chronic pain: Secondary | ICD-10-CM

## 2016-07-15 DIAGNOSIS — M7918 Myalgia, other site: Secondary | ICD-10-CM

## 2016-07-15 MED ORDER — OXYMORPHONE HCL ER 5 MG PO TB12: 5.0000 mg | ORAL_TABLET | Freq: Every morning | ORAL | 0 refills | Status: DC

## 2016-07-15 MED ORDER — OXYMORPHONE HCL ER 20 MG PO TB12: 20.0000 mg | ORAL_TABLET | Freq: Two times a day (BID) | ORAL | 0 refills | Status: DC

## 2016-07-15 NOTE — Progress Notes (Signed)
Name: Hector Gonzalez   MRN: [id number]    DOB: 10-23-1965   Date:07/15/2016       Progress Note  Subjective  Chief Complaint  Chief Complaint  Patient presents with  . Follow-up    1 mo  . Medication Refill    HPI   Myofascial Pain: Pt. Presents for medication refills and follow up on Myofascial pain. He has lower back pain radiating down into his thighs, pain rated at 2-3/10. Pain is better in warm weather. Relieved with Opana 25 mg in AM and 20 mg at night. Patient reports medication working to relieve his symptoms, no reported adverse effects. He also tells me that his insurance company is no longer going to cover Oxymorphone but will cover Oxycontin instead and that he used to be on Oxycontin a long time ago (10 years ago) before being switched to Opana, whch has done well to relieve his pain.    Past Medical History:  Diagnosis Date  . Hyperlipidemia   . Nerve pain     Past Surgical History:  Procedure Laterality Date  . ANKLE SURGERY    . KNEE SURGERY      Family History  Problem Relation Age of Onset  . Cancer Mother   . Cancer    . Diabetes    . Mental illness      Social History   Social History  . Marital status: Single    Spouse name: N/A  . Number of children: 1  . Years of education: 81   Occupational History  .      [employer]   Social History Main Topics  . Smoking status: Never Smoker  . Smokeless tobacco: Never Used  . Alcohol use No  . Drug use: No  . Sexual activity: Not on file   Other Topics Concern  . Not on file   Social History Narrative   Patients lives at home with his wife Hector Gonzalez and works full time for [employer] school education   Right handed   Caffeine quit      Current Outpatient Prescriptions:  .  b complex vitamins capsule, Take 1 capsule by mouth daily., Disp: , Rfl:  .  fluticasone (FLONASE) 50 MCG/ACT nasal spray, Place 2 sprays into both nostrils daily., Disp: 16 g, Rfl: 0 .  gabapentin (NEURONTIN)  600 MG tablet, Take 1 tablet (600 mg total) by mouth daily., Disp: 90 tablet, Rfl: 0 .  ibuprofen (ADVIL,MOTRIN) 200 MG tablet, Take 200 mg by mouth every 6 (six) hours as needed., Disp: , Rfl:  .  oxymorphone (OPANA ER) 20 MG 12 hr tablet, Take 1 tablet (20 mg total) by mouth every 12 (twelve) hours., Disp: 60 tablet, Rfl: 0 .  oxymorphone (OPANA ER) 5 MG 12 hr tablet, Take 1 tablet (5 mg total) by mouth every morning., Disp: 30 tablet, Rfl: 0 .  sertraline (ZOLOFT) 100 MG tablet, TAKE ONE TABLET EVERY DAY, Disp: 90 tablet, Rfl: 0  No Known Allergies   ROS  Please see history of present illness for complete description of ROS  Objective  Vitals:   07/15/16 1608  BP: 117/76  Pulse: 81  Resp: 16  Temp: 98 F (36.7 C)  TempSrc: Oral  SpO2: 97%  Weight: 168 lb 1.6 oz (76.2 kg)  Height: 5\' 6"  (1.676 m)    Physical Exam  Constitutional: He is oriented to person, place, and time and well-developed, well-nourished, and in no distress.  HENT:  Head: Normocephalic and atraumatic.  Cardiovascular: Normal rate, regular rhythm and normal heart sounds.   No murmur heard. Pulmonary/Chest: Effort normal and breath sounds normal. He has no wheezes.  Abdominal: Soft. Bowel sounds are normal. There is no tenderness.  Neurological: He is alert and oriented to person, place, and time.  Psychiatric: Mood, memory, affect and judgment normal.  Nursing note and vitals reviewed.     Assessment & Plan  1. Chronic myofascial pain We will consider tapering off Opana and starting on Oxycontin, patient advised to bring old records to determine the dosage of OxyContin that he was on before as well as any pertinent insurance communication. Patient compliant with controlled substances agreement and refills for Opana were provided. Follow up in one month  - oxymorphone (OPANA ER) 20 MG 12 hr tablet; Take 1 tablet (20 mg total) by mouth every 12 (twelve) hours.  Dispense: 60 tablet; Refill: 0 -  oxymorphone (OPANA ER) 5 MG 12 hr tablet; Take 1 tablet (5 mg total) by mouth every morning.  Dispense: 30 tablet; Refill: 0   Hector Gonzalez Hector Gonzalez [facility] 07/15/2016 4:16 PM

## 2016-07-21 ENCOUNTER — Other Ambulatory Visit: Payer: Self-pay | Admitting: Family Medicine

## 2016-07-21 DIAGNOSIS — G8929 Other chronic pain: Secondary | ICD-10-CM

## 2016-07-21 DIAGNOSIS — M7918 Myalgia, other site: Secondary | ICD-10-CM

## 2016-08-04 DIAGNOSIS — M7631 Iliotibial band syndrome, right leg: Secondary | ICD-10-CM | POA: Diagnosis not present

## 2016-08-04 DIAGNOSIS — M1711 Unilateral primary osteoarthritis, right knee: Secondary | ICD-10-CM | POA: Diagnosis not present

## 2016-08-14 ENCOUNTER — Ambulatory Visit: Payer: BLUE CROSS/BLUE SHIELD | Admitting: Family Medicine

## 2016-08-18 ENCOUNTER — Encounter: Payer: Self-pay | Admitting: Family Medicine

## 2016-08-18 ENCOUNTER — Ambulatory Visit (INDEPENDENT_AMBULATORY_CARE_PROVIDER_SITE_OTHER): Payer: BLUE CROSS/BLUE SHIELD | Admitting: Family Medicine

## 2016-08-18 VITALS — BP 120/76 | HR 76 | Temp 98.9°F | Resp 16 | Ht 66.0 in | Wt 164.3 lb

## 2016-08-18 DIAGNOSIS — J111 Influenza due to unidentified influenza virus with other respiratory manifestations: Secondary | ICD-10-CM

## 2016-08-18 DIAGNOSIS — M791 Myalgia: Secondary | ICD-10-CM | POA: Diagnosis not present

## 2016-08-18 DIAGNOSIS — M7918 Myalgia, other site: Secondary | ICD-10-CM

## 2016-08-18 DIAGNOSIS — G8929 Other chronic pain: Secondary | ICD-10-CM

## 2016-08-18 MED ORDER — OXYMORPHONE HCL ER 20 MG PO TB12: 20.0000 mg | ORAL_TABLET | Freq: Two times a day (BID) | ORAL | 0 refills | Status: DC

## 2016-08-18 MED ORDER — OXYMORPHONE HCL ER 5 MG PO TB12: 5.0000 mg | ORAL_TABLET | Freq: Every morning | ORAL | 0 refills | Status: DC

## 2016-08-18 NOTE — Progress Notes (Signed)
Name: Hector Gonzalez   MRN: [id number]    DOB: [birth year]   Date:08/18/2016       Progress Note  Subjective  Chief Complaint  Chief Complaint  Patient presents with  . Follow-up    1 mo  . Medication Refill    Influenza  This is a new problem. The current episode started in the past 7 days. Associated symptoms include congestion, a fever, vomiting and weakness. Pertinent negatives include no abdominal pain, arthralgias, chest pain, coughing or nausea. Treatments tried: OTC Cold and Flu preparations, Zyrtec. The treatment provided mild relief.   Myofascial Pain: Pt. Presents for medication refills and follow up on Myofascial pain. He has lower back pain radiating down into his thighs, pain rated at 2-3/10. Relieved with Opana 25 mg in AM and 20 mg at night. Patient reports medication working to relieve his symptoms, no reported adverse effects.   Past Medical History:  Diagnosis Date  . Hyperlipidemia   . Nerve pain     Past Surgical History:  Procedure Laterality Date  . ANKLE SURGERY    . KNEE SURGERY      Family History  Problem Relation Age of Onset  . Cancer Mother   . Cancer    . Diabetes    . Mental illness      Social History   Social History  . Marital status: Single    Spouse name: N/A  . Number of children: 1  . Years of education: 77   Occupational History  .      [employer]   Social History Main Topics  . Smoking status: Never Smoker  . Smokeless tobacco: Never Used  . Alcohol use No  . Drug use: No  . Sexual activity: Not on file   Other Topics Concern  . Not on file   Social History Narrative   Patients lives at home with his wife [name] and works full time for [employer] school education   Right handed   Caffeine quit      Current Outpatient Prescriptions:  .  b complex vitamins capsule, Take 1 capsule by mouth daily., Disp: , Rfl:  .  fluticasone (FLONASE) 50 MCG/ACT nasal spray, Place 2 sprays into both nostrils daily.,  Disp: 16 g, Rfl: 0 .  gabapentin (NEURONTIN) 600 MG tablet, TAKE ONE TABLET BY MOUTH EVERY DAY, Disp: 90 tablet, Rfl: 1 .  ibuprofen (ADVIL,MOTRIN) 200 MG tablet, Take 200 mg by mouth every 6 (six) hours as needed., Disp: , Rfl:  .  oxymorphone (OPANA ER) 20 MG 12 hr tablet, Take 1 tablet (20 mg total) by mouth every 12 (twelve) hours., Disp: 60 tablet, Rfl: 0 .  oxymorphone (OPANA ER) 5 MG 12 hr tablet, Take 1 tablet (5 mg total) by mouth every morning., Disp: 30 tablet, Rfl: 0 .  sertraline (ZOLOFT) 100 MG tablet, TAKE ONE TABLET EVERY DAY, Disp: 90 tablet, Rfl: 0  No Known Allergies   Review of Systems  Constitutional: Positive for fever.  HENT: Positive for congestion.   Respiratory: Negative for cough.   Cardiovascular: Negative for chest pain.  Gastrointestinal: Positive for vomiting. Negative for abdominal pain and nausea.  Musculoskeletal: Negative for arthralgias.  Neurological: Positive for weakness.    Objective  Vitals:   08/18/16 1419  BP: 120/76  Pulse: 76  Resp: 16  Temp: 98.9 F (37.2 C)  TempSrc: Oral  SpO2: 96%  Weight: 164 lb 4.8 oz (74.5 kg)  Height: 5\' 6"  (1.676 m)    Physical Exam  Constitutional: He is oriented to person, place, and time and well-developed, well-nourished, and in no distress.  HENT:  Head: Normocephalic and atraumatic.  Right Ear: Tympanic membrane and ear canal normal.  Left Ear: Tympanic membrane and ear canal normal.  Nose: Right sinus exhibits no maxillary sinus tenderness. Left sinus exhibits no maxillary sinus tenderness.  Mouth/Throat: Posterior oropharyngeal erythema present.  Cardiovascular: Normal rate, regular rhythm and normal heart sounds.   No murmur heard. Pulmonary/Chest: Effort normal and breath sounds normal. He has no wheezes.  Abdominal: Soft. Bowel sounds are normal. There is no tenderness.  Neurological: He is alert and oriented to person, place, and time.  Psychiatric: Mood, memory, affect and judgment  normal.  Nursing note and vitals reviewed.   Assessment & Plan  1. Chronic myofascial pain Stable and responsive to opioid therapy, refills provided.  - oxymorphone (OPANA ER) 5 MG 12 hr tablet; Take 1 tablet (5 mg total) by mouth every morning.  Dispense: 30 tablet; Refill: 0 - oxymorphone (OPANA ER) 20 MG 12 hr tablet; Take 1 tablet (20 mg total) by mouth every 12 (twelve) hours.  Dispense: 60 tablet; Refill: 0  2. Influenza Symptoms resolving, advised that he may take Imodium for diarrhea, symptomatic treatment for coughing, he is outside the window for treatment with Tamiflu.   Perlie Scheuring Asad A. Faylene KurtzShah [facility] 08/18/2016 3:05 PM

## 2016-09-15 ENCOUNTER — Ambulatory Visit: Payer: BLUE CROSS/BLUE SHIELD | Admitting: Family Medicine

## 2016-09-22 ENCOUNTER — Ambulatory Visit: Payer: BLUE CROSS/BLUE SHIELD | Admitting: Family Medicine

## 2016-09-29 ENCOUNTER — Ambulatory Visit (INDEPENDENT_AMBULATORY_CARE_PROVIDER_SITE_OTHER): Payer: BLUE CROSS/BLUE SHIELD | Admitting: Family Medicine

## 2016-09-29 ENCOUNTER — Encounter: Payer: Self-pay | Admitting: Family Medicine

## 2016-09-29 DIAGNOSIS — G8929 Other chronic pain: Secondary | ICD-10-CM

## 2016-09-29 DIAGNOSIS — M791 Myalgia: Secondary | ICD-10-CM | POA: Diagnosis not present

## 2016-09-29 DIAGNOSIS — M7918 Myalgia, other site: Secondary | ICD-10-CM

## 2016-09-29 MED ORDER — OXYMORPHONE HCL ER 20 MG PO TB12: 20.0000 mg | ORAL_TABLET | Freq: Two times a day (BID) | ORAL | 0 refills | Status: DC

## 2016-09-29 MED ORDER — OXYMORPHONE HCL ER 5 MG PO TB12: 5.0000 mg | ORAL_TABLET | Freq: Every morning | ORAL | 0 refills | Status: DC

## 2016-09-29 NOTE — Progress Notes (Signed)
Name: Hector Gonzalez   MRN: [id number]    DOB: 1966/06/10   Date:09/29/2016       Progress Note  Subjective  Chief Complaint  Chief Complaint  Patient presents with  . Follow-up    1 mo  . Medication Refill    HPI  Myofascial Pain: Pt. Presents for medication refills and follow up on Myofascial pain. He has lower back pain radiating down into both thighs, pain rated at 2-3/10. Relieved with Opana 25 mg in AM and 20 mg at night. Patient reports medication working to relieve his symptoms, no reported adverse effects.  Past Medical History:  Diagnosis Date  . Hyperlipidemia   . Nerve pain     Past Surgical History:  Procedure Laterality Date  . ANKLE SURGERY    . KNEE SURGERY      Family History  Problem Relation Age of Onset  . Cancer Mother   . Cancer    . Diabetes    . Mental illness      Social History   Social History  . Marital status: Single    Spouse name: N/A  . Number of children: 1  . Years of education: 1   Occupational History  .      [employer]   Social History Main Topics  . Smoking status: Never Smoker  . Smokeless tobacco: Never Used  . Alcohol use No  . Drug use: No  . Sexual activity: Not on file   Other Topics Concern  . Not on file   Social History Narrative   Patients lives at home with his wife Hector Gonzalez and works full time for [employer] school education   Right handed   Caffeine quit      Current Outpatient Prescriptions:  .  b complex vitamins capsule, Take 1 capsule by mouth daily., Disp: , Rfl:  .  fluticasone (FLONASE) 50 MCG/ACT nasal spray, Place 2 sprays into both nostrils daily., Disp: 16 g, Rfl: 0 .  gabapentin (NEURONTIN) 600 MG tablet, TAKE ONE TABLET BY MOUTH EVERY DAY, Disp: 90 tablet, Rfl: 1 .  ibuprofen (ADVIL,MOTRIN) 200 MG tablet, Take 200 mg by mouth every 6 (six) hours as needed., Disp: , Rfl:  .  oxymorphone (OPANA ER) 20 MG 12 hr tablet, Take 1 tablet (20 mg total) by mouth every 12 (twelve)  hours., Disp: 60 tablet, Rfl: 0 .  oxymorphone (OPANA ER) 5 MG 12 hr tablet, Take 1 tablet (5 mg total) by mouth every morning., Disp: 30 tablet, Rfl: 0 .  sertraline (ZOLOFT) 100 MG tablet, TAKE ONE TABLET EVERY DAY, Disp: 90 tablet, Rfl: 0  No Known Allergies   ROS  Please see history of present illness for complete discussion of ROS  Objective  Vitals:   09/29/16 1609  BP: 118/79  Pulse: 67  Resp: 16  Temp: 97.5 F (36.4 C)  TempSrc: Oral  SpO2: 97%  Weight: 171 lb 4.8 oz (77.7 kg)  Height:  (1.651 m)    Physical Exam  Constitutional: He is oriented to person, place, and time and well-developed, well-nourished, and in no distress.  HENT:  Head: Normocephalic and atraumatic.  Cardiovascular: Normal rate, regular rhythm and normal heart sounds.   No murmur heard. Pulmonary/Chest: Effort normal and breath sounds normal. He has no wheezes.  Abdominal: Soft. Bowel sounds are normal. There is no tenderness.  Musculoskeletal:       Lumbar back: He exhibits no tenderness and no  pain.  Neurological: He is alert and oriented to person, place, and time.  Psychiatric: Mood, memory, affect and judgment normal.  Nursing note and vitals reviewed.      Assessment & Plan  1. Chronic myofascial pain Stable and responsive to opioid therapy, patient complaint wih controlled substances agreement. Refills provided and follow-up in one month - oxymorphone (OPANA ER) 20 MG 12 hr tablet; Take 1 tablet (20 mg total) by mouth every 12 (twelve) hours.  Dispense: 60 tablet; Refill: 0 - oxymorphone (OPANA ER) 5 MG 12 hr tablet; Take 1 tablet (5 mg total) by mouth every morning.  Dispense: 30 tablet; Refill: 0   Hector Gonzalez [facility] 09/29/2016 4:22 PM

## 2016-10-20 ENCOUNTER — Other Ambulatory Visit: Payer: Self-pay | Admitting: Family Medicine

## 2016-10-20 DIAGNOSIS — M7918 Myalgia, other site: Secondary | ICD-10-CM

## 2016-10-20 DIAGNOSIS — G8929 Other chronic pain: Secondary | ICD-10-CM

## 2016-10-27 DIAGNOSIS — M1711 Unilateral primary osteoarthritis, right knee: Secondary | ICD-10-CM | POA: Diagnosis not present

## 2016-10-29 ENCOUNTER — Ambulatory Visit (INDEPENDENT_AMBULATORY_CARE_PROVIDER_SITE_OTHER): Payer: BLUE CROSS/BLUE SHIELD | Admitting: Family Medicine

## 2016-10-29 ENCOUNTER — Encounter: Payer: Self-pay | Admitting: Family Medicine

## 2016-10-29 DIAGNOSIS — M791 Myalgia: Secondary | ICD-10-CM

## 2016-10-29 DIAGNOSIS — G8929 Other chronic pain: Secondary | ICD-10-CM

## 2016-10-29 DIAGNOSIS — M7918 Myalgia, other site: Secondary | ICD-10-CM

## 2016-10-29 MED ORDER — OXYMORPHONE HCL ER 20 MG PO TB12: 20.0000 mg | ORAL_TABLET | Freq: Two times a day (BID) | ORAL | 0 refills | Status: DC

## 2016-10-29 MED ORDER — OXYMORPHONE HCL ER 5 MG PO TB12: 5.0000 mg | ORAL_TABLET | Freq: Every morning | ORAL | 0 refills | Status: DC

## 2016-10-29 NOTE — Progress Notes (Signed)
Name: Hector Gonzalez   MRN: [id number]    DOB: 01/22/1966   Date:10/29/2016       Progress Note  Subjective  Chief Complaint  Chief Complaint  Patient presents with  . Medication Refill    HPI  Myofascial Pain: Pt. Presents for medication refills and follow up on Myofascial pain. He has lower back pain radiating down into both thighs, pain rated at 2-3/10 today. Relieved with Opana 25 mg in AM and 20 mg at night. In addition, patient takes gabapentin 600 mg daily Patient reports medication working to relieve his symptoms, no reported adverse effects.  Past Medical History:  Diagnosis Date  . Hyperlipidemia   . Nerve pain     Past Surgical History:  Procedure Laterality Date  . ANKLE SURGERY    . KNEE SURGERY      Family History  Problem Relation Age of Onset  . Cancer Mother   . Cancer    . Diabetes    . Mental illness      Social History   Social History  . Marital status: Single    Spouse name: N/A  . Number of children: 1  . Years of education: 101   Occupational History  .      [employer]   Social History Main Topics  . Smoking status: Never Smoker  . Smokeless tobacco: Never Used  . Alcohol use No  . Drug use: No  . Sexual activity: Not on file   Other Topics Concern  . Not on file   Social History Narrative   Patients lives at home with his wife [name] and works full time for [employer] school education   Right handed   Caffeine quit      Current Outpatient Prescriptions:  .  b complex vitamins capsule, Take 1 capsule by mouth daily., Disp: , Rfl:  .  fluticasone (FLONASE) 50 MCG/ACT nasal spray, Place 2 sprays into both nostrils daily., Disp: 16 g, Rfl: 0 .  gabapentin (NEURONTIN) 600 MG tablet, TAKE ONE TABLET EVERY DAY, Disp: 90 tablet, Rfl: 0 .  ibuprofen (ADVIL,MOTRIN) 200 MG tablet, Take 200 mg by mouth every 6 (six) hours as needed., Disp: , Rfl:  .  oxymorphone (OPANA ER) 20 MG 12 hr tablet, Take 1 tablet (20 mg total) by  mouth every 12 (twelve) hours., Disp: 60 tablet, Rfl: 0 .  oxymorphone (OPANA ER) 5 MG 12 hr tablet, Take 1 tablet (5 mg total) by mouth every morning., Disp: 30 tablet, Rfl: 0 .  sertraline (ZOLOFT) 100 MG tablet, TAKE ONE TABLET EVERY DAY, Disp: 90 tablet, Rfl: 0  No Known Allergies   ROS  Please see history of present illness for complete discussion of ROS  Objective  Vitals:   10/29/16 1602  BP: 126/70  Pulse: 64  Resp: 18  Temp: 98.2 F (36.8 C)  TempSrc: Oral  SpO2: 97%  Weight: 169 lb 12.8 oz (77 kg)  Height: 5\' 5"  (1.651 m)    Physical Exam  Constitutional: He is oriented to person, place, and time and well-developed, well-nourished, and in no distress.  HENT:  Head: Normocephalic and atraumatic.  Cardiovascular: Normal rate, regular rhythm and normal heart sounds.   No murmur heard. Pulmonary/Chest: Effort normal and breath sounds normal. He has no wheezes.  Abdominal: Soft. Bowel sounds are normal. There is no tenderness.  Musculoskeletal:       Lumbar back: He exhibits no tenderness and no pain.  Neurological: He is alert and oriented to person, place, and time.  Psychiatric: Mood, memory, affect and judgment normal.  Nursing note and vitals reviewed.     Assessment & Plan  1. Chronic myofascial pain Stable and responsive to opioid therapy, patient compliant with controlled substances agreement. Refills provided and follow-up in one month - oxymorphone (OPANA ER) 5 MG 12 hr tablet; Take 1 tablet (5 mg total) by mouth every morning.  Dispense: 30 tablet; Refill: 0 - oxymorphone (OPANA ER) 20 MG 12 hr tablet; Take 1 tablet (20 mg total) by mouth every 12 (twelve) hours.  Dispense: 60 tablet; Refill: 0    Irena Gaydos Asad A. Faylene KurtzShah [facility] 10/29/2016 4:07 PM

## 2016-11-07 ENCOUNTER — Other Ambulatory Visit: Payer: Self-pay | Admitting: Family Medicine

## 2016-12-01 ENCOUNTER — Ambulatory Visit (INDEPENDENT_AMBULATORY_CARE_PROVIDER_SITE_OTHER): Payer: BLUE CROSS/BLUE SHIELD | Admitting: Family Medicine

## 2016-12-01 ENCOUNTER — Encounter: Payer: Self-pay | Admitting: Family Medicine

## 2016-12-01 DIAGNOSIS — G8929 Other chronic pain: Secondary | ICD-10-CM | POA: Diagnosis not present

## 2016-12-01 DIAGNOSIS — M791 Myalgia: Secondary | ICD-10-CM | POA: Diagnosis not present

## 2016-12-01 DIAGNOSIS — M7918 Myalgia, other site: Secondary | ICD-10-CM

## 2016-12-01 MED ORDER — OXYMORPHONE HCL ER 5 MG PO TB12: 5.0000 mg | ORAL_TABLET | Freq: Every morning | ORAL | 0 refills | Status: DC

## 2016-12-01 MED ORDER — OXYMORPHONE HCL ER 20 MG PO TB12: 20.0000 mg | ORAL_TABLET | Freq: Two times a day (BID) | ORAL | 0 refills | Status: DC

## 2016-12-01 NOTE — Progress Notes (Signed)
Name: Hector Gonzalez   MRN: [id number]    DOB: 03-Jan-1966   Date:12/01/2016       Progress Note  Subjective  Chief Complaint  Chief Complaint  Patient presents with  . Medication Refill    HPI  Myofascial Pain: Pain is described as a burning sensation in his lower back running through his thighs, he takes Opana 25 mg in AM and 20 mg at night.  Pain is relieved with Opana, no side effects from opioids reported. He   Past Medical History:  Diagnosis Date  . Hyperlipidemia   . Nerve pain     Past Surgical History:  Procedure Laterality Date  . ANKLE SURGERY    . KNEE SURGERY      Family History  Problem Relation Age of Onset  . Cancer Mother   . Cancer Unknown   . Diabetes Unknown   . Mental illness Unknown     Social History   Social History  . Marital status: Single    Spouse name: N/A  . Number of children: 1  . Years of education: 28   Occupational History  .      Hector Gonzalez   Social History Main Topics  . Smoking status: Never Smoker  . Smokeless tobacco: Never Used  . Alcohol use No  . Drug use: No  . Sexual activity: Not on file   Other Topics Concern  . Not on file   Social History Narrative   Patients lives at home with his wife Hector Gonzalez and works full time for [employer] school education   Right handed   Caffeine quit      Current Outpatient Prescriptions:  .  b complex vitamins capsule, Take 1 capsule by mouth daily., Disp: , Rfl:  .  fluticasone (FLONASE) 50 MCG/ACT nasal spray, Place 2 sprays into both nostrils daily., Disp: 16 g, Rfl: 0 .  gabapentin (NEURONTIN) 600 MG tablet, TAKE ONE TABLET EVERY DAY, Disp: 90 tablet, Rfl: 0 .  ibuprofen (ADVIL,MOTRIN) 200 MG tablet, Take 200 mg by mouth every 6 (six) hours as needed., Disp: , Rfl:  .  oxymorphone (OPANA ER) 20 MG 12 hr tablet, Take 1 tablet (20 mg total) by mouth every 12 (twelve) hours., Disp: 60 tablet, Rfl: 0 .  oxymorphone (OPANA ER) 5 MG 12 hr tablet, Take 1 tablet (5  mg total) by mouth every morning., Disp: 30 tablet, Rfl: 0 .  sertraline (ZOLOFT) 100 MG tablet, TAKE 1 TABLET BY MOUTH DAILY, Disp: 90 tablet, Rfl: 0  No Known Allergies   ROS Please see history of present illness for complete discussion of ROS   Objective  Vitals:   12/01/16 1602  BP: 120/70  Pulse: 70  Resp: 18  Temp: 97.7 F (36.5 C)  TempSrc: Oral  SpO2: 96%  Weight: 172 lb 12.8 oz (78.4 kg)  Height: 5\' 5"  (1.651 m)    Physical Exam  Constitutional: He is oriented to person, place, and time and well-developed, well-nourished, and in no distress.  HENT:  Head: Normocephalic and atraumatic.  Cardiovascular: Normal rate, regular rhythm and normal heart sounds.   No murmur heard. Pulmonary/Chest: Effort normal and breath sounds normal. He has no wheezes.  Abdominal: Soft. Bowel sounds are normal. There is no tenderness.  Musculoskeletal:       Lumbar back: He exhibits no tenderness and no pain.  Neurological: He is alert and oriented to person, place, and time.  Psychiatric: Mood, memory,  affect and judgment normal.  Nursing note and vitals reviewed.      Assessment & Plan  1. Chronic myofascial pain Stable and responsive to opioid therapy, patient compliant with controlled substances agreement. Refills provided - oxymorphone (OPANA ER) 5 MG 12 hr tablet; Take 1 tablet (5 mg total) by mouth every morning.  Dispense: 30 tablet; Refill: 0 - oxymorphone (OPANA ER) 20 MG 12 hr tablet; Take 1 tablet (20 mg total) by mouth every 12 (twelve) hours.  Dispense: 60 tablet; Refill: 0   Hector Gonzalez Hector Gonzalez [facility] 12/01/2016 4:08 PM

## 2016-12-25 ENCOUNTER — Ambulatory Visit (INDEPENDENT_AMBULATORY_CARE_PROVIDER_SITE_OTHER): Payer: BLUE CROSS/BLUE SHIELD | Admitting: Family Medicine

## 2016-12-25 ENCOUNTER — Encounter: Payer: Self-pay | Admitting: Family Medicine

## 2016-12-25 DIAGNOSIS — M791 Myalgia: Secondary | ICD-10-CM | POA: Diagnosis not present

## 2016-12-25 DIAGNOSIS — G8929 Other chronic pain: Secondary | ICD-10-CM

## 2016-12-25 DIAGNOSIS — E782 Mixed hyperlipidemia: Secondary | ICD-10-CM | POA: Diagnosis not present

## 2016-12-25 DIAGNOSIS — M7918 Myalgia, other site: Secondary | ICD-10-CM

## 2016-12-25 DIAGNOSIS — E785 Hyperlipidemia, unspecified: Secondary | ICD-10-CM | POA: Insufficient documentation

## 2016-12-25 MED ORDER — OXYMORPHONE HCL ER 20 MG PO TB12: 20.0000 mg | ORAL_TABLET | Freq: Two times a day (BID) | ORAL | 0 refills | Status: DC

## 2016-12-25 MED ORDER — OXYMORPHONE HCL ER 5 MG PO TB12: 5.0000 mg | ORAL_TABLET | Freq: Every morning | ORAL | 0 refills | Status: DC

## 2016-12-25 NOTE — Progress Notes (Signed)
Name: Hector Gonzalez   MRN: [id number]    DOB: [birth year]   Date:12/25/2016       Progress Note  Subjective  Chief Complaint  Chief Complaint  Patient presents with  . Medication Refill    Hyperlipidemia  This is a chronic problem. The problem is uncontrolled. Recent lipid tests were reviewed and are high. He is currently on no antihyperlipidemic treatment. Risk factors for coronary artery disease include dyslipidemia and family history.    Myofascial Pain: Pain is described as a burning sensation in his lower back running through his thighs, he takes Opana 25 mg in AM and 20 mg at night. He is taking medication as reported. Alsong with opioids, he is also taking Gabapentin and Sertraline Pain is relieved with Opana, no side effects from opioids reported.  Past Medical History:  Diagnosis Date  . Hyperlipidemia   . Nerve pain     Past Surgical History:  Procedure Laterality Date  . ANKLE SURGERY    . KNEE SURGERY      Family History  Problem Relation Age of Onset  . Cancer Mother   . Cancer Unknown   . Diabetes Unknown   . Mental illness Unknown     Social History   Social History  . Marital status: Single    Spouse name: N/A  . Number of children: 1  . Years of education: 40   Occupational History  .      [employer]   Social History Main Topics  . Smoking status: Never Smoker  . Smokeless tobacco: Never Used  . Alcohol use No  . Drug use: No  . Sexual activity: Not on file   Other Topics Concern  . Not on file   Social History Narrative   Patients lives at home with his wife [name] and works full time for [employer] school education   Right handed   Caffeine quit      Current Outpatient Prescriptions:  .  b complex vitamins capsule, Take 1 capsule by mouth daily., Disp: , Rfl:  .  fluticasone (FLONASE) 50 MCG/ACT nasal spray, Place 2 sprays into both nostrils daily., Disp: 16 g, Rfl: 0 .  gabapentin (NEURONTIN) 600 MG tablet, TAKE ONE  TABLET EVERY DAY, Disp: 90 tablet, Rfl: 0 .  ibuprofen (ADVIL,MOTRIN) 200 MG tablet, Take 200 mg by mouth every 6 (six) hours as needed., Disp: , Rfl:  .  oxymorphone (OPANA ER) 20 MG 12 hr tablet, Take 1 tablet (20 mg total) by mouth every 12 (twelve) hours., Disp: 60 tablet, Rfl: 0 .  oxymorphone (OPANA ER) 5 MG 12 hr tablet, Take 1 tablet (5 mg total) by mouth every morning., Disp: 30 tablet, Rfl: 0 .  sertraline (ZOLOFT) 100 MG tablet, TAKE 1 TABLET BY MOUTH DAILY, Disp: 90 tablet, Rfl: 0  No Known Allergies   ROS  Please see history of present illness for complete discussion of ROS  Objective  Vitals:   12/25/16 1543  BP: 118/80  Pulse: 68  Resp: 18  Temp: 97.9 F (36.6 C)  TempSrc: Oral  SpO2: 97%  Weight: 169 lb 12.8 oz (77 kg)  Height: 5\' 5"  (1.651 m)    Physical Exam  Constitutional: He is oriented to person, place, and time and well-developed, well-nourished, and in no distress.  HENT:  Head: Normocephalic and atraumatic.  Cardiovascular: Normal rate, regular rhythm and normal heart sounds.   No murmur heard. Pulmonary/Chest: Effort normal and  breath sounds normal.  Musculoskeletal:       Lumbar back: He exhibits no tenderness, no pain and no spasm.  Neurological: He is alert and oriented to person, place, and time.  Psychiatric: Memory, affect and judgment normal.  Nursing note and vitals reviewed.      Assessment & Plan  1. Chronic myofascial pain Stable and responsive to Opana, refills provided, patient compliant with controlled substances agreement - oxymorphone (OPANA ER) 5 MG 12 hr tablet; Take 1 tablet (5 mg total) by mouth every morning.  Dispense: 30 tablet; Refill: 0 - oxymorphone (OPANA ER) 20 MG 12 hr tablet; Take 1 tablet (20 mg total) by mouth every 12 (twelve) hours.  Dispense: 60 tablet; Refill: 0  2. Mixed hyperlipidemia Repeat FLP and consider starting on statin - COMPLETE METABOLIC PANEL WITH GFR - Lipid panel   Tejas Seawood Asad A.  Faylene KurtzShah [facility] 12/25/2016 3:47 PM

## 2016-12-31 ENCOUNTER — Ambulatory Visit: Payer: BLUE CROSS/BLUE SHIELD | Admitting: Family Medicine

## 2017-01-19 ENCOUNTER — Other Ambulatory Visit: Payer: Self-pay | Admitting: Family Medicine

## 2017-01-19 DIAGNOSIS — M7918 Myalgia, other site: Secondary | ICD-10-CM

## 2017-01-19 DIAGNOSIS — G8929 Other chronic pain: Secondary | ICD-10-CM

## 2017-01-22 ENCOUNTER — Encounter: Payer: Self-pay | Admitting: Family Medicine

## 2017-01-22 ENCOUNTER — Ambulatory Visit (INDEPENDENT_AMBULATORY_CARE_PROVIDER_SITE_OTHER): Payer: BLUE CROSS/BLUE SHIELD | Admitting: Family Medicine

## 2017-01-22 VITALS — BP 142/73 | HR 67 | Temp 98.0°F | Resp 16 | Ht 65.0 in | Wt 170.7 lb

## 2017-01-22 DIAGNOSIS — G8929 Other chronic pain: Secondary | ICD-10-CM

## 2017-01-22 DIAGNOSIS — M791 Myalgia: Secondary | ICD-10-CM | POA: Diagnosis not present

## 2017-01-22 DIAGNOSIS — R030 Elevated blood-pressure reading, without diagnosis of hypertension: Secondary | ICD-10-CM | POA: Diagnosis not present

## 2017-01-22 DIAGNOSIS — M7918 Myalgia, other site: Secondary | ICD-10-CM

## 2017-01-22 MED ORDER — OXYMORPHONE HCL ER 20 MG PO TB12: 20.0000 mg | ORAL_TABLET | Freq: Two times a day (BID) | ORAL | 0 refills | Status: DC

## 2017-01-22 MED ORDER — OXYMORPHONE HCL ER 5 MG PO TB12: 5.0000 mg | ORAL_TABLET | Freq: Every morning | ORAL | 0 refills | Status: DC

## 2017-01-22 NOTE — Progress Notes (Signed)
Name: Hector Gonzalez   MRN: [id number]    DOB: 08/09/1965   Date:01/22/2017       Progress Note  Subjective  Chief Complaint  Chief Complaint  Patient presents with  . Follow-up    1 mo  . Medication Refill    HPI  Chronic Myofascial Pain: Chronic low back pain, described as a burning sensation in his lower back running through his thighs, he takes Opana 25 mg in AM and 20 mg at night. He is taking medication as reported. Along with opioids, he is also taking Gabapentin and Sertraline,Which seemed to help relieve his symptoms Pain is relieved with Opana, no side effects from opioids reported.   Past Medical History:  Diagnosis Date  . Hyperlipidemia   . Nerve pain     Past Surgical History:  Procedure Laterality Date  . ANKLE SURGERY    . KNEE SURGERY      Family History  Problem Relation Age of Onset  . Cancer Mother   . Cancer Unknown   . Diabetes Unknown   . Mental illness Unknown     Social History   Social History  . Marital status: Single    Spouse name: N/A  . Number of children: 1  . Years of education: 2412   Occupational History  .      [employer]   Social History Main Topics  . Smoking status: Never Smoker  . Smokeless tobacco: Never Used  . Alcohol use No  . Drug use: No  . Sexual activity: Not on file   Other Topics Concern  . Not on file   Social History Narrative   Patients lives at home with his wife [name] and works full time for [employer]   High school education   Right handed   Caffeine quit      Current Outpatient Prescriptions:  .  b complex vitamins capsule, Take 1 capsule by mouth daily., Disp: , Rfl:  .  fluticasone (FLONASE) 50 MCG/ACT nasal spray, Place 2 sprays into both nostrils daily., Disp: 16 g, Rfl: 0 .  gabapentin (NEURONTIN) 600 MG tablet, TAKE ONE TABLET BY MOUTH EVERY DAY, Disp: 90 tablet, Rfl: 0 .  ibuprofen (ADVIL,MOTRIN) 200 MG tablet, Take 200 mg by mouth every 6 (six) hours as needed., Disp: , Rfl:  .   oxymorphone (OPANA ER) 20 MG 12 hr tablet, Take 1 tablet (20 mg total) by mouth every 12 (twelve) hours., Disp: 60 tablet, Rfl: 0 .  oxymorphone (OPANA ER) 5 MG 12 hr tablet, Take 1 tablet (5 mg total) by mouth every morning., Disp: 30 tablet, Rfl: 0 .  sertraline (ZOLOFT) 100 MG tablet, TAKE 1 TABLET BY MOUTH DAILY, Disp: 90 tablet, Rfl: 0  No Known Allergies   ROS  Please see history of present illness for complete discussion of ROS  Objective  Vitals:   01/22/17 1550  BP: (!) 142/73  Pulse: 67  Resp: 16  Temp: 98 F (36.7 C)  TempSrc: Oral  SpO2: 98%  Weight: 170 lb 11.2 oz (77.4 kg)  Height: 5\' 5"  (1.651 m)    Physical Exam  Constitutional: He is oriented to person, place, and time and well-developed, well-nourished, and in no distress.  HENT:  Head: Normocephalic and atraumatic.  Cardiovascular: Normal rate, regular rhythm and normal heart sounds.   No murmur heard. Pulmonary/Chest: Effort normal and breath sounds normal. He has no wheezes.  Abdominal: Soft. Bowel sounds are normal. There is no  tenderness.  Musculoskeletal: He exhibits no edema.       Lumbar back: He exhibits no tenderness, no pain and no spasm.  Neurological: He is alert and oriented to person, place, and time.  Psychiatric: Mood, memory, affect and judgment normal.  Nursing note and vitals reviewed.     Assessment & Plan  1. Chronic myofascial pain Chronic symptoms, stable on   opioid treatment, patient compliant with controlled substances agreement. Refills provided - oxymorphone (OPANA ER) 20 MG 12 hr tablet; Take 1 tablet (20 mg total) by mouth every 12 (twelve) hours.  Dispense: 60 tablet; Refill: 0 - oxymorphone (OPANA ER) 5 MG 12 hr tablet; Take 1 tablet (5 mg total) by mouth every morning.  Dispense: 30 tablet; Refill: 0  2. Elevated BP without diagnosis of hypertension  Likely elevated because he was rushing in for his appointment, no history of hypertension, advised to keep blood  pressure logs for next month  Hector Gonzalez A. Faylene KurtzShah [facility] 01/22/2017 4:00 PM

## 2017-01-26 DIAGNOSIS — M1711 Unilateral primary osteoarthritis, right knee: Secondary | ICD-10-CM | POA: Diagnosis not present

## 2017-01-26 DIAGNOSIS — M7631 Iliotibial band syndrome, right leg: Secondary | ICD-10-CM | POA: Diagnosis not present

## 2017-02-06 ENCOUNTER — Other Ambulatory Visit: Payer: Self-pay | Admitting: Family Medicine

## 2017-02-16 DIAGNOSIS — M1711 Unilateral primary osteoarthritis, right knee: Secondary | ICD-10-CM | POA: Diagnosis not present

## 2017-02-18 ENCOUNTER — Ambulatory Visit: Payer: BLUE CROSS/BLUE SHIELD | Admitting: Family Medicine

## 2017-02-24 ENCOUNTER — Encounter: Payer: Self-pay | Admitting: Family Medicine

## 2017-02-24 ENCOUNTER — Ambulatory Visit (INDEPENDENT_AMBULATORY_CARE_PROVIDER_SITE_OTHER): Payer: BLUE CROSS/BLUE SHIELD | Admitting: Family Medicine

## 2017-02-24 VITALS — BP 142/78 | HR 73 | Temp 98.1°F | Resp 14 | Wt 168.1 lb

## 2017-02-24 DIAGNOSIS — M791 Myalgia: Secondary | ICD-10-CM | POA: Diagnosis not present

## 2017-02-24 DIAGNOSIS — M7918 Myalgia, other site: Secondary | ICD-10-CM

## 2017-02-24 DIAGNOSIS — R030 Elevated blood-pressure reading, without diagnosis of hypertension: Secondary | ICD-10-CM | POA: Diagnosis not present

## 2017-02-24 DIAGNOSIS — G8929 Other chronic pain: Secondary | ICD-10-CM

## 2017-02-24 MED ORDER — OXYMORPHONE HCL ER 20 MG PO TB12: 20.0000 mg | ORAL_TABLET | Freq: Two times a day (BID) | ORAL | 0 refills | Status: DC

## 2017-02-24 MED ORDER — OXYMORPHONE HCL ER 5 MG PO TB12: 5.0000 mg | ORAL_TABLET | Freq: Every morning | ORAL | 0 refills | Status: DC

## 2017-02-24 NOTE — Progress Notes (Signed)
Name: Hector Gonzalez   MRN: [id number]    DOB: 1965/12/30   Date:02/24/2017       Progress Note  Subjective  Chief Complaint  Chief Complaint  Patient presents with  . Medication Refill    HPI  Myofascial Pain: Pain is described as a burning sensation in his lower back running through his thighs, he takes Opana 25 mg in AM and 20 mg at night. He is taking medication as reported. Along with opioids, he is also taking Gabapentin and Sertraline Pain is relieved with Opana, no side effects from opioids reported. Today, his pain is rated at 3/10  Past Medical History:  Diagnosis Date  . Hyperlipidemia   . Nerve pain     Past Surgical History:  Procedure Laterality Date  . ANKLE SURGERY    . KNEE SURGERY      Family History  Problem Relation Age of Onset  . Cancer Mother   . Cancer Unknown   . Diabetes Unknown   . Mental illness Unknown     Social History   Social History  . Marital status: Single    Spouse name: N/A  . Number of children: 1  . Years of education: 51   Occupational History  .      [employer]   Social History Main Topics  . Smoking status: Never Smoker  . Smokeless tobacco: Never Used  . Alcohol use No  . Drug use: No  . Sexual activity: Yes   Other Topics Concern  . Not on file   Social History Narrative   Patients lives at home with his wife [name] and works full time for [employer] school education   Right handed   Caffeine quit      Current Outpatient Prescriptions:  .  b complex vitamins capsule, Take 1 capsule by mouth daily., Disp: , Rfl:  .  gabapentin (NEURONTIN) 600 MG tablet, TAKE ONE TABLET BY MOUTH EVERY DAY, Disp: 90 tablet, Rfl: 0 .  ibuprofen (ADVIL,MOTRIN) 200 MG tablet, Take 200 mg by mouth every 6 (six) hours as needed., Disp: , Rfl:  .  oxymorphone (OPANA ER) 20 MG 12 hr tablet, Take 1 tablet (20 mg total) by mouth every 12 (twelve) hours., Disp: 60 tablet, Rfl: 0 .  oxymorphone (OPANA ER) 5 MG 12 hr  tablet, Take 1 tablet (5 mg total) by mouth every morning., Disp: 30 tablet, Rfl: 0 .  sertraline (ZOLOFT) 100 MG tablet, TAKE ONE TABLET EVERY DAY, Disp: 90 tablet, Rfl: 0 .  fluticasone (FLONASE) 50 MCG/ACT nasal spray, Place 2 sprays into both nostrils daily. (Patient not taking: Reported on 02/24/2017), Disp: 16 g, Rfl: 0  No Known Allergies   ROS  Please see history of present illness for complete discussion of ROS  Objective  Vitals:   02/24/17 1602  BP: (!) 142/78  Pulse: 73  Resp: 14  Temp: 98.1 F (36.7 C)  TempSrc: Oral  SpO2: 97%  Weight: 168 lb 1.6 oz (76.2 kg)    Physical Exam  Constitutional: He is oriented to person, place, and time and well-developed, well-nourished, and in no distress.  HENT:  Head: Normocephalic and atraumatic.  Cardiovascular: Normal rate, regular rhythm and normal heart sounds.   No murmur heard. Pulmonary/Chest: Effort normal and breath sounds normal.  Musculoskeletal:       Lumbar back: He exhibits no tenderness, no pain and no spasm.  Neurological: He is alert and oriented to person,  place, and time.  Psychiatric: Memory, affect and judgment normal.  Nursing note and vitals reviewed.    Assessment & Plan  1. Chronic myofascial pain Patient compliant with controlled substances agreement, pain is responsive to Opana taken as prescribed, refills provided - oxymorphone (OPANA ER) 20 MG 12 hr tablet; Take 1 tablet (20 mg total) by mouth every 12 (twelve) hours.  Dispense: 60 tablet; Refill: 0 - oxymorphone (OPANA ER) 5 MG 12 hr tablet; Take 1 tablet (5 mg total) by mouth every morning.  Dispense: 30 tablet; Refill: 0  2. Elevated BP without diagnosis of hypertension We will reassess blood pressure in one month   Roxann Vierra Asad A. Faylene KurtzShah [facility] 02/24/2017 4:28 PM

## 2017-03-26 ENCOUNTER — Ambulatory Visit: Payer: BLUE CROSS/BLUE SHIELD | Admitting: Family Medicine

## 2017-04-02 ENCOUNTER — Telehealth: Payer: Self-pay | Admitting: Family Medicine

## 2017-04-02 ENCOUNTER — Ambulatory Visit: Payer: BLUE CROSS/BLUE SHIELD | Admitting: Family Medicine

## 2017-04-02 DIAGNOSIS — M7918 Myalgia, other site: Secondary | ICD-10-CM

## 2017-04-02 DIAGNOSIS — G8929 Other chronic pain: Secondary | ICD-10-CM

## 2017-04-02 MED ORDER — OXYMORPHONE HCL ER 20 MG PO TB12: 20.0000 mg | ORAL_TABLET | Freq: Two times a day (BID) | ORAL | 0 refills | Status: DC

## 2017-04-02 MED ORDER — OXYMORPHONE HCL ER 5 MG PO TB12: 5.0000 mg | ORAL_TABLET | Freq: Every morning | ORAL | 0 refills | Status: DC

## 2017-04-02 NOTE — Telephone Encounter (Signed)
Per our conversation we asked the 4pm patient to resch due to weather. He works in [place] and will be glad to resch but is out of his pain meds. You the dr asked that I send you the message so that you could print out his medication. I will call him when this is done and his wife is coming to pick this up.

## 2017-04-02 NOTE — Telephone Encounter (Signed)
Prescription for Opana ER 20 mg and 5 mg tablets are printed and ready for pickup

## 2017-04-08 ENCOUNTER — Ambulatory Visit: Payer: BLUE CROSS/BLUE SHIELD | Admitting: Family Medicine

## 2017-04-16 ENCOUNTER — Ambulatory Visit: Payer: BLUE CROSS/BLUE SHIELD | Admitting: Family Medicine

## 2017-04-28 ENCOUNTER — Telehealth: Payer: Self-pay | Admitting: Family Medicine

## 2017-04-28 DIAGNOSIS — M7918 Myalgia, other site: Secondary | ICD-10-CM

## 2017-04-28 DIAGNOSIS — G8929 Other chronic pain: Secondary | ICD-10-CM

## 2017-04-28 NOTE — Telephone Encounter (Signed)
Copied from CRM #[id number]. >> Apr 28, 2017 11:13 AM Raquel SarnaHayes, Teresa G wrote: Pt runs out on Fri.  Pt needs a refill appt.  Dr. Sherryll BurgerShah isn't in office until Tues. 13th  Wondering if he can get Rx filled and come in next week to see Dr. Sherryll BurgerShah?

## 2017-04-29 MED ORDER — OXYMORPHONE HCL ER 5 MG PO TB12: 5.0000 mg | ORAL_TABLET | Freq: Every morning | ORAL | 0 refills | Status: DC

## 2017-04-29 MED ORDER — OXYMORPHONE HCL ER 20 MG PO TB12
20.0000 mg | ORAL_TABLET | Freq: Two times a day (BID) | ORAL | 0 refills | Status: DC
Start: 2017-05-02 — End: 2017-05-05

## 2017-04-29 NOTE — Telephone Encounter (Signed)
What medication

## 2017-04-29 NOTE — Addendum Note (Signed)
Addended by: Alba CorySOWLES, Henry Utsey F on: 04/29/2017 01:30 PM   Modules accepted: Orders

## 2017-04-29 NOTE — Telephone Encounter (Addendum)
Please notify patient he has been placed on the schedule for 0820am on Monday morning 05/04/17 with Dr. Sherryll BurgerShah.  After discussing plan of care with Dr. Carlynn PurlSowles, it is determined that Dr. Carlynn PurlSowles will provide a very limited supply to get him through until Monday morning. This will be made available at the front desk for patient to pick up.

## 2017-04-29 NOTE — Telephone Encounter (Signed)
Dr. Sherryll BurgerShah is currently out of the office therefore routed the message to Maurice SmallEmily boyce, NP; she is covering his box, so she can review it.

## 2017-04-29 NOTE — Telephone Encounter (Signed)
Copied from CRM #[id number]. Topic: Inquiry >> Apr 28, 2017 11:13 AM Raquel SarnaHayes, Teresa G wrote: Pt runs out on Fri.  Pt needs a refill appt.  Dr. Sherryll BurgerShah isn't in office until Tues. 13th  Wondering if he can get Rx filled and come in next week to see Dr. Sherryll BurgerShah?   Please call pt back to let him know if this can be done.

## 2017-04-29 NOTE — Telephone Encounter (Signed)
Opana; pt states he had an apt with Dr. Sherryll BurgerShah and it was canceled by us and then he had to cancel. Pt is willing to make an apt to see Dr. Sherryll BurgerShah but he stated he will be out of Opana on Friday.

## 2017-04-29 NOTE — Addendum Note (Signed)
Addended by: Doren CustardBOYCE, Rooney Gladwin E on: 04/29/2017 12:56 PM   Modules accepted: Orders

## 2017-05-04 ENCOUNTER — Ambulatory Visit: Payer: BLUE CROSS/BLUE SHIELD | Admitting: Family Medicine

## 2017-05-05 ENCOUNTER — Encounter: Payer: Self-pay | Admitting: Family Medicine

## 2017-05-05 ENCOUNTER — Ambulatory Visit: Payer: BLUE CROSS/BLUE SHIELD | Admitting: Family Medicine

## 2017-05-05 DIAGNOSIS — G8929 Other chronic pain: Secondary | ICD-10-CM

## 2017-05-05 DIAGNOSIS — M7918 Myalgia, other site: Secondary | ICD-10-CM | POA: Diagnosis not present

## 2017-05-05 MED ORDER — OXYMORPHONE HCL ER 5 MG PO TB12: 5.0000 mg | ORAL_TABLET | Freq: Every morning | ORAL | 0 refills | Status: DC

## 2017-05-05 MED ORDER — OXYMORPHONE HCL ER 20 MG PO TB12: 20.0000 mg | ORAL_TABLET | Freq: Two times a day (BID) | ORAL | 0 refills | Status: DC

## 2017-05-05 MED ORDER — SERTRALINE HCL 100 MG PO TABS: 100.0000 mg | ORAL_TABLET | Freq: Every day | ORAL | 0 refills | Status: DC

## 2017-05-05 NOTE — Progress Notes (Signed)
Name: Hector Gonzalez   MRN: [id number]    DOB: 01/21/1966   Date:05/05/2017       Progress Note  Subjective  Chief Complaint  Chief Complaint  Patient presents with  . Medication Refill    pain meds and zoloft     HPI  Myofascial Pain:  Pt. Is here for follow up of Myofascial pain, described a stinging and stinging pain in lower back radiating down to his thighs, he has been evaluated by Neurology for the same. He has concurrent symptoms of depression and Gabapentin 600 mg daily. This combination seems to help relieve his pain, no side effects reported.    Past Medical History:  Diagnosis Date  . Hyperlipidemia   . Nerve pain     Past Surgical History:  Procedure Laterality Date  . ANKLE SURGERY    . KNEE SURGERY      Family History  Problem Relation Age of Onset  . Cancer Mother   . Cancer Unknown   . Diabetes Unknown   . Mental illness Unknown     Social History   Socioeconomic History  . Marital status: Single    Spouse name: Not on file  . Number of children: 1  . Years of education: 6412  . Highest education level: Not on file  Social Needs  . Financial resource strain: Not on file  . Food insecurity - worry: Not on file  . Food insecurity - inability: Not on file  . Transportation needs - medical: Not on file  . Transportation needs - non-medical: Not on file  Occupational History    Comment: [employer]  Tobacco Use  . Smoking status: Never Smoker  . Smokeless tobacco: Never Used  Substance and Sexual Activity  . Alcohol use: No  . Drug use: No  . Sexual activity: Yes  Other Topics Concern  . Not on file  Social History Narrative   Patients lives at home with his wife [name] and works full time for [employer]   High school education   Right handed   Caffeine quit      Current Outpatient Medications:  .  b complex vitamins capsule, Take 1 capsule by mouth daily., Disp: , Rfl:  .  gabapentin (NEURONTIN) 600 MG tablet, TAKE ONE TABLET BY  MOUTH EVERY DAY, Disp: 90 tablet, Rfl: 0 .  ibuprofen (ADVIL,MOTRIN) 200 MG tablet, Take 200 mg by mouth every 6 (six) hours as needed., Disp: , Rfl:  .  oxymorphone (OPANA ER) 20 MG 12 hr tablet, Take 1 tablet (20 mg total) every 12 (twelve) hours by mouth., Disp: 6 tablet, Rfl: 0 .  oxymorphone (OPANA ER) 5 MG 12 hr tablet, Take 1 tablet (5 mg total) every morning by mouth., Disp: 3 tablet, Rfl: 0 .  sertraline (ZOLOFT) 100 MG tablet, TAKE ONE TABLET EVERY DAY, Disp: 90 tablet, Rfl: 0 .  fluticasone (FLONASE) 50 MCG/ACT nasal spray, Place 2 sprays into both nostrils daily. (Patient not taking: Reported on 02/24/2017), Disp: 16 g, Rfl: 0  No Known Allergies   ROS  Please see HPI for complete discussion of ROS  Objective  Vitals:   05/05/17 1555  BP: 140/82  Pulse: 74  Resp: 16  Temp: 98 F (36.7 C)  TempSrc: Oral  SpO2: 96%  Weight: 167 lb 11.2 oz (76.1 kg)    Physical Exam  Constitutional: He is oriented to person, place, and time and well-developed, well-nourished, and in no distress.  Cardiovascular: Normal  rate, regular rhythm and normal heart sounds.  No murmur heard. Pulmonary/Chest: Effort normal and breath sounds normal. He has no wheezes.  Musculoskeletal:       Lumbar back: He exhibits no tenderness, no pain and no spasm.  Neurological: He is alert and oriented to person, place, and time.  Psychiatric: Mood, memory, affect and judgment normal.  Nursing note and vitals reviewed.   Assessment & Plan  1. Chronic myofascial pain Stable, explained that he will need to be referred to pain management for chronic myofascial pain.  Verbalizes agreement - oxymorphone (OPANA ER) 20 MG 12 hr tablet; Take 1 tablet (20 mg total) every 12 (twelve) hours by mouth.  Dispense: 60 tablet; Refill: 0 - oxymorphone (OPANA ER) 5 MG 12 hr tablet; Take 1 tablet (5 mg total) every morning by mouth.  Dispense: 30 tablet; Refill: 0 - sertraline (ZOLOFT) 100 MG tablet; Take 1 tablet (100 mg  total) daily by mouth.  Dispense: 90 tablet; Refill: 0 - Ambulatory referral to Pain Clinic   [facility] Asad A. Faylene KurtzShah [facility] 05/05/2017 4:06 PM

## 2017-05-26 ENCOUNTER — Ambulatory Visit: Payer: BLUE CROSS/BLUE SHIELD | Admitting: Family Medicine

## 2017-06-03 ENCOUNTER — Encounter: Payer: Self-pay | Admitting: Family Medicine

## 2017-06-03 ENCOUNTER — Ambulatory Visit: Payer: BLUE CROSS/BLUE SHIELD | Admitting: Family Medicine

## 2017-06-03 DIAGNOSIS — G8929 Other chronic pain: Secondary | ICD-10-CM | POA: Diagnosis not present

## 2017-06-03 DIAGNOSIS — M7918 Myalgia, other site: Secondary | ICD-10-CM | POA: Diagnosis not present

## 2017-06-03 MED ORDER — OXYMORPHONE HCL ER 5 MG PO TB12: 5.0000 mg | ORAL_TABLET | Freq: Every morning | ORAL | 0 refills | Status: DC

## 2017-06-03 MED ORDER — OXYMORPHONE HCL ER 20 MG PO TB12: 20.0000 mg | ORAL_TABLET | Freq: Two times a day (BID) | ORAL | 0 refills | Status: DC

## 2017-06-03 NOTE — Progress Notes (Signed)
Name: Hector Gonzalez   MRN: [id number]    DOB: 03/27/1966   Date:06/03/2017       Progress Note  Subjective  Chief Complaint  Chief Complaint  Patient presents with  . Follow-up    1 month   . Medication Refill    HPI  Pt. presents for follow up of myofascial pain, rated at 3/10 today, described as a stinging and burning sensation in the lower back radiating to his thighs. Pain is worse in the morning and gets better in the evenings. He takes Gabapentin, Opana and Sertraline for treatment of myofascial pain. He has been referred to Pain Clinic and has an appointment in 2 weeks.    Past Medical History:  Diagnosis Date  . Hyperlipidemia   . Nerve pain     Past Surgical History:  Procedure Laterality Date  . ANKLE SURGERY    . KNEE SURGERY      Family History  Problem Relation Age of Onset  . Cancer Mother   . Cancer Unknown   . Diabetes Unknown   . Mental illness Unknown     Social History   Socioeconomic History  . Marital status: Single    Spouse name: Not on file  . Number of children: 1  . Years of education: 3212  . Highest education level: Not on file  Social Needs  . Financial resource strain: Not on file  . Food insecurity - worry: Not on file  . Food insecurity - inability: Not on file  . Transportation needs - medical: Not on file  . Transportation needs - non-medical: Not on file  Occupational History    Comment: [employer]  Tobacco Use  . Smoking status: Never Smoker  . Smokeless tobacco: Never Used  Substance and Sexual Activity  . Alcohol use: No  . Drug use: No  . Sexual activity: Yes  Other Topics Concern  . Not on file  Social History Narrative   Patients lives at home with his wife [name] and works full time for [employer]   High school education   Right handed   Caffeine quit      Current Outpatient Medications:  .  b complex vitamins capsule, Take 1 capsule by mouth daily., Disp: , Rfl:  .  gabapentin (NEURONTIN) 600 MG  tablet, TAKE ONE TABLET BY MOUTH EVERY DAY, Disp: 90 tablet, Rfl: 0 .  ibuprofen (ADVIL,MOTRIN) 200 MG tablet, Take 200 mg by mouth every 6 (six) hours as needed., Disp: , Rfl:  .  oxymorphone (OPANA ER) 20 MG 12 hr tablet, Take 1 tablet (20 mg total) every 12 (twelve) hours by mouth., Disp: 60 tablet, Rfl: 0 .  oxymorphone (OPANA ER) 5 MG 12 hr tablet, Take 1 tablet (5 mg total) every morning by mouth., Disp: 30 tablet, Rfl: 0 .  sertraline (ZOLOFT) 100 MG tablet, Take 1 tablet (100 mg total) daily by mouth., Disp: 90 tablet, Rfl: 0 .  fluticasone (FLONASE) 50 MCG/ACT nasal spray, Place 2 sprays into both nostrils daily. (Patient not taking: Reported on 02/24/2017), Disp: 16 g, Rfl: 0  No Known Allergies   ROS  Please see HPI for complete discussion of ROS  Objective  Vitals:   06/03/17 1602  BP: 138/86  Pulse: 64  Resp: 14  Temp: 98.3 F (36.8 C)  TempSrc: Oral  SpO2: 98%  Weight: 170 lb 8 oz (77.3 kg)    Physical Exam  Constitutional: He is oriented to person, place, and  time and well-developed, well-nourished, and in no distress.  Cardiovascular: Normal rate, regular rhythm and normal heart sounds.  No murmur heard. Pulmonary/Chest: Effort normal and breath sounds normal. He has no wheezes.  Musculoskeletal:       Lumbar back: He exhibits no tenderness and no pain.  Neurological: He is alert and oriented to person, place, and time.  Psychiatric: Mood, memory, affect and judgment normal.  Nursing note and vitals reviewed.      Assessment & Plan  1. Chronic myofascial pain Stable, referral to pain management, refills for opioids provided and patient compliant with controlled substances agreement - oxymorphone (OPANA ER) 20 MG 12 hr tablet; Take 1 tablet (20 mg total) by mouth every 12 (twelve) hours.  Dispense: 60 tablet; Refill: 0 - oxymorphone (OPANA ER) 5 MG 12 hr tablet; Take 1 tablet (5 mg total) by mouth every morning.  Dispense: 30 tablet; Refill: 0   Maryclare Nydam  Asad A. Faylene KurtzShah [facility] 06/03/2017 4:08 PM

## 2017-06-18 ENCOUNTER — Ambulatory Visit
Payer: BLUE CROSS/BLUE SHIELD | Attending: Student in an Organized Health Care Education/Training Program | Admitting: Student in an Organized Health Care Education/Training Program

## 2017-06-18 ENCOUNTER — Encounter: Payer: Self-pay | Admitting: Student in an Organized Health Care Education/Training Program

## 2017-06-18 VITALS — BP 156/102 | HR 72 | Temp 97.8°F | Resp 16 | Ht 66.0 in | Wt 168.0 lb

## 2017-06-18 DIAGNOSIS — M25522 Pain in left elbow: Secondary | ICD-10-CM | POA: Insufficient documentation

## 2017-06-18 DIAGNOSIS — E785 Hyperlipidemia, unspecified: Secondary | ICD-10-CM | POA: Diagnosis not present

## 2017-06-18 DIAGNOSIS — Z79899 Other long term (current) drug therapy: Secondary | ICD-10-CM | POA: Insufficient documentation

## 2017-06-18 DIAGNOSIS — M545 Low back pain, unspecified: Secondary | ICD-10-CM

## 2017-06-18 DIAGNOSIS — Z765 Malingerer [conscious simulation]: Secondary | ICD-10-CM

## 2017-06-18 DIAGNOSIS — G629 Polyneuropathy, unspecified: Secondary | ICD-10-CM | POA: Diagnosis not present

## 2017-06-18 DIAGNOSIS — F339 Major depressive disorder, recurrent, unspecified: Secondary | ICD-10-CM | POA: Insufficient documentation

## 2017-06-18 DIAGNOSIS — Z79891 Long term (current) use of opiate analgesic: Secondary | ICD-10-CM | POA: Insufficient documentation

## 2017-06-18 DIAGNOSIS — G894 Chronic pain syndrome: Secondary | ICD-10-CM | POA: Insufficient documentation

## 2017-06-18 DIAGNOSIS — G8929 Other chronic pain: Secondary | ICD-10-CM

## 2017-06-18 DIAGNOSIS — M7918 Myalgia, other site: Secondary | ICD-10-CM | POA: Insufficient documentation

## 2017-06-18 NOTE — Progress Notes (Signed)
Safety precautions to be maintained throughout the outpatient stay will include: orient to surroundings, keep bed in low position, maintain call bell within reach at all times, provide assistance with transfer out of bed and ambulation.  

## 2017-06-18 NOTE — Progress Notes (Signed)
Patient's Name: Hector Gonzalez  MRN: [id number]  Referring Provider: Roselee Nova, MD  DOB: 06-23-1966  PCP: Roselee Nova, MD  DOS: 06/18/2017  Note by: Gillis Santa, MD  Service setting: Ambulatory outpatient  Specialty: Interventional Pain Management  Location: [facility] (AMB) Pain Management Facility  Visit type: Initial Patient Evaluation  Patient type: New Patient   Primary Reason(s) for Visit: Encounter for initial evaluation of one or more chronic problems (new to examiner) potentially causing chronic pain, and posing a threat to normal musculoskeletal function. (Level of risk: High) CC: Generalized Body Aches (neuropathic pain)  HPI  Hector Gonzalez is a 51 y.o. year old, male patient, who comes today to see [place] for the first time for an initial evaluation of his chronic pain. He has Paresthesia; Chronic myofascial pain; Neuropathic pain; Clinical depression; Continuous opioid dependence ([place]); Dyslipidemia; Pain in left elbow; Sweating abnormality; Sensation of fullness in left ear; Allergic rhinitis; and Hyperlipidemia on their problem list. Today he comes in for evaluation of his Generalized Body Aches (neuropathic pain)  Pain Assessment: Location: Other (Comment)(all over) (hurts all over but the pain seems to go the site of the body part he is using the most) Radiating: if walking legs hurt, if typing hands hurt etc.  Onset: More than a month ago Duration: Chronic pain Quality: Sharp, Pins and needles, Burning, Discomfort, Nagging Severity: 3 /10 (self-reported pain score)  Note: Reported level is compatible with observation.                         When using our objective Pain Scale, levels between 6 and 10/10 are said to belong in an emergency room, as it progressively worsens from a 6/10, described as severely limiting, requiring emergency care not usually available at an outpatient pain management facility. At a 6/10 level, communication becomes difficult and requires great effort.  Assistance to reach the emergency department may be required. Facial flushing and profuse sweating along with potentially dangerous increases in heart rate and blood pressure will be evident. Effect on ADL: (very sensitive to temperature changes. ) Timing: Intermittent Modifying factors: rest or getting off his feet, medications  Onset and Duration: Sudden Cause of pain: Unknown Severity: Getting better, NAS-11 at its worse: 10/10, NAS-11 at its best: 1/10, NAS-11 now: 3/10 and NAS-11 on the average: 3/10 Timing: Morning and Afternoon Aggravating Factors: Bending and Twisting Alleviating Factors: Lying down, Medications, Resting and Sitting Associated Problems: Constipation, Depression, Temperature changes and Tingling Quality of Pain: Agonizing, Burning, Distressing, Dreadful, Horrible, Hot, Sharp, Shooting, Stabbing and Tingling Previous Examinations or Tests: CT scan, MRI scan, Nerve block, X-rays, Nerve conduction test, Neurological evaluation, Chiropractic evaluation and Psychiatric evaluation Previous Treatments: Chiropractic manipulations, Epidural steroid injections, Physical Therapy, Relaxation therapy, Spinal cord stimulator, Stretching exercises and Trigger point injections  The patient comes into the clinics today for the first time for a chronic pain management evaluation.   51 year old gentleman who presents with a chief complaint of diffuse "neuropathy".  Patient states that he has been dealing with neuropathy for the last 20 years.  He states that he has had  nerve conduction studies performed as well as imaging studies.  Patient states that he is on Opana 25 mg in the morning and 20 mg in the evening which helps with his chronic pain state.  Patient is unable to recall what his nerve conduction study showed, what his imaging studies showed.  Parts of the HPI  were obtained from the patient's wife who interjected herself during periods of our conversation.  Patient states that he has  tried various classes of medications including anti-inflammatories, Tylenol, gabapentin, muscle relaxants which were not helpful.  He states that he is tried physical therapy and states that that was not helpful.  He is unable to recall the doses of his medications.  Today I took the time to provide the patient with information regarding my pain practice. The patient was informed that my practice is divided into two sections: an interventional pain management section, as well as a completely separate and distinct medication management section. I explained that I have procedure days for my interventional therapies, and evaluation days for follow-ups and medication management. Because of the amount of documentation required during both, they are kept separated. This means that there is the possibility that he may be scheduled for a procedure on one day, and medication management the next. I have also informed him that because of staffing and facility limitations, I no longer take patients for medication management only. To illustrate the reasons for this, I gave the patient the example of surgeons, and how inappropriate it would be to refer a patient to his/her care, just to write for the post-surgical antibiotics on a surgery done by a different surgeon.   Because interventional pain management is my board-certified specialty, the patient was informed that joining my practice means that they are open to any and all interventional therapies. I made it clear that this does not mean that they will be forced to have any procedures done. What this means is that I believe interventional therapies to be essential part of the diagnosis and proper management of chronic pain conditions. Therefore, patients not interested in these interventional alternatives will be better served under the care of a different practitioner.  The patient was also made aware of my Comprehensive Pain Management Safety Guidelines where by  joining my practice, they limit all of their nerve blocks and joint injections to those done by our practice, for as long as we are retained to manage their care.   Historic Controlled Substance Pharmacotherapy Review  PMP and historical list of controlled substances: Opana 25 mg every morning, 20 mg every afternoon, MME equals 135-140 Medications: The patient did not bring the medication(s) to the appointment, as requested in our "New Patient Package" Pharmacodynamics: Desired effects: Analgesia: The patient reports >50% benefit. Reported improvement in function: The patient reports medication allows him to accomplish basic ADLs. Clinically meaningful improvement in function (CMIF): Sustained CMIF goals met Perceived effectiveness: Described as relatively effective, allowing for increase in activities of daily living (ADL) Undesirable effects: Side-effects or Adverse reactions: None reported Historical Monitoring: The patient  reports that he does not use drugs. List of all UDS Test(s): Lab Results  Component Value Date   PCPQUANT Negative 10/04/2015   List of other Serum/Urine Drug Screening Test(s):  Lab Results  Component Value Date   PCPQUANT Negative 10/04/2015   Historical Background Evaluation: [place] PMP: Six (6) year initial data search conducted.             [place] Department of public safety, offender search: Editor, commissioning Information) Non-contributory Risk Assessment Profile: Aberrant behavior: claims that "nothing else works", diminished ability to recognize a problem with one's behavior or use of the medication, extensive time discussing medicaiton, significant opioid tolerance and Continued interruptions and interjections from wife regarding opioid medications Risk factors for fatal opioid overdose: age 14-42 years old and  caucasian Fatal overdose hazard ratio (HR): Calculation deferred Non-fatal overdose hazard ratio (HR): 8.87 for 100-199 MME/day Risk of opioid abuse or  dependence: 0.7-3.0% with doses ? 36 MME/day and 6.1-26% with doses ? 120 MME/day. Substance use disorder (SUD) risk level: Moderate Opioid risk tool (ORT) (Total Score): 3 Opioid Risk Tool - 06/18/17 0910      Family History of Substance Abuse   Alcohol  Negative    Illegal Drugs  Negative    Rx Drugs  Negative      Personal History of Substance Abuse   Alcohol  Negative    Illegal Drugs  Negative    Rx Drugs  Negative      Psychological Disease   Psychological Disease  Positive    ADD  Negative    OCD  Negative    Bipolar  Negative    Schizophrenia  Negative    Depression  Positive zoloft is not managing depression/anxiety as well as it used to .    zoloft is not managing depression/anxiety as well as it used to .      Total Score   Opioid Risk Tool Scoring  3    Opioid Risk Interpretation  Low Risk      ORT Scoring interpretation table:  Score <3 = Low Risk for SUD  Score between 4-7 = Moderate Risk for SUD  Score >8 = High Risk for Opioid Abuse   PHQ-2 Depression Scale:  Total score:    PHQ-2 Scoring interpretation table: (Score and probability of major depressive disorder)  Score 0 = No depression  Score 1 = 15.4% Probability  Score 2 = 21.1% Probability  Score 3 = 38.4% Probability  Score 4 = 45.5% Probability  Score 5 = 56.4% Probability  Score 6 = 78.6% Probability   PHQ-9 Depression Scale:  Total score:    PHQ-9 Scoring interpretation table:  Score 0-4 = No depression  Score 5-9 = Mild depression  Score 10-14 = Moderate depression  Score 15-19 = Moderately severe depression  Score 20-27 = Severe depression (2.4 times higher risk of SUD and 2.89 times higher risk of overuse)   Pharmacologic Plan: Further information needed.            Initial impression: Poor candidate for opioid analgesics.  Meds   Current Outpatient Medications:  .  b complex vitamins capsule, Take 1 capsule by mouth daily., Disp: , Rfl:  .  Calcium Polycarbophil 625 MG CHEW,  Chew 1 tablet by mouth daily., Disp: , Rfl:  .  gabapentin (NEURONTIN) 600 MG tablet, TAKE ONE TABLET BY MOUTH EVERY DAY (Patient taking differently: TAKE ONE TABLET BY MOUTH EVERY DAY  (patient takes 300 mg in the morning and 300 mg qhs)), Disp: 90 tablet, Rfl: 0 .  glucosamine-chondroitin 500-400 MG tablet, Take 1 tablet by mouth daily., Disp: , Rfl:  .  oxymorphone (OPANA ER) 20 MG 12 hr tablet, Take 1 tablet (20 mg total) by mouth every 12 (twelve) hours., Disp: 60 tablet, Rfl: 0 .  oxymorphone (OPANA ER) 5 MG 12 hr tablet, Take 1 tablet (5 mg total) by mouth every morning., Disp: 30 tablet, Rfl: 0 .  sertraline (ZOLOFT) 100 MG tablet, Take 1 tablet (100 mg total) daily by mouth., Disp: 90 tablet, Rfl: 0 .  fluticasone (FLONASE) 50 MCG/ACT nasal spray, Place 2 sprays into both nostrils daily. (Patient not taking: Reported on 02/24/2017), Disp: 16 g, Rfl: 0 .  ibuprofen (ADVIL,MOTRIN) 200 MG tablet, Take 200 mg  by mouth every 6 (six) hours as needed., Disp: , Rfl:   Imaging Review   Thoracic Imaging: Thoracic MR wo contrast:  Results for orders placed in visit on 01/01/01  MR Thoracic Spine Wo Contrast   Narrative FINDINGS CLINICAL DATA:  BACK PAIN.  EVALUATE FOR THORACIC DISC. THORACIC SPINE TWO VIEWS NORMAL ALIGNMENT.  NO FRACTURE.  DISC SPACES WELL MAINTAINED.  THERE IS NO MASS. IMPRESSION NEGATIVE. MRI THORACIC SPINE NO COMPARISON. THE THORACIC ALIGNMENT IS NORMAL.  NO FRACTURE OR MASS IS SEEN.  THE CORD HAS NORMAL SIGNAL.  THE DISCS APPEAR HEALTHY.  THERE IS NO DISC HERNIATION OR SPINAL STENOSIS.  THERE IS MILD DISC DEGENERATION AT T11-12 AND T8-9. IMPRESSION VERY MILD DEGENERATIVE CHANGES.  NEGATIVE FOR DISC HERNIATION OR ACUTE ABNORMALITY.    Thoracic DG 2-3 views:  Results for orders placed in visit on 01/01/01  DG Thoracic Spine 1 View   Narrative FINDINGS CLINICAL DATA:  BACK PAIN.  EVALUATE FOR THORACIC DISC. THORACIC SPINE TWO VIEWS NORMAL ALIGNMENT.  NO FRACTURE.   DISC SPACES WELL MAINTAINED.  THERE IS NO MASS. IMPRESSION NEGATIVE. MRI THORACIC SPINE NO COMPARISON. THE THORACIC ALIGNMENT IS NORMAL.  NO FRACTURE OR MASS IS SEEN.  THE CORD HAS NORMAL SIGNAL.  THE DISCS APPEAR HEALTHY.  THERE IS NO DISC HERNIATION OR SPINAL STENOSIS.  THERE IS MILD DISC DEGENERATION AT T11-12 AND T8-9. IMPRESSION VERY MILD DEGENERATIVE CHANGES.  NEGATIVE FOR DISC HERNIATION OR ACUTE ABNORMALITY.    Complexity Note: Imaging results reviewed. Results shared with Mr. Zentner, using Layman's terms.                         ROS  Cardiovascular History: No reported cardiovascular signs or symptoms such as High blood pressure, coronary artery disease, abnormal heart rate or rhythm, heart attack, blood thinner therapy or heart weakness and/or failure Pulmonary or Respiratory History: Snoring  Neurological History: Abnormal skin sensations (Peripheral Neuropathy) Review of Past Neurological Studies: No results found for this or any previous visit. Psychological-Psychiatric History: Anxiousness and Depressed Gastrointestinal History: Alternating episodes iof diarrhea and constipation (IBS-Irritable bowe syndrome) Genitourinary History: No reported renal or genitourinary signs or symptoms such as difficulty voiding or producing urine, peeing blood, non-functioning kidney, kidney stones, difficulty emptying the bladder, difficulty controlling the flow of urine, or chronic kidney disease Hematological History: No reported hematological signs or symptoms such as prolonged bleeding, low or poor functioning platelets, bruising or bleeding easily, hereditary bleeding problems, low energy levels due to low hemoglobin or being anemic Endocrine History: No reported endocrine signs or symptoms such as high or low blood sugar, rapid heart rate due to high thyroid levels, obesity or weight gain due to slow thyroid or thyroid disease Rheumatologic History: No reported rheumatological signs and  symptoms such as fatigue, joint pain, tenderness, swelling, redness, heat, stiffness, decreased range of motion, with or without associated rash Musculoskeletal History: Negative for myasthenia gravis, muscular dystrophy, multiple sclerosis or malignant hyperthermia Work History: Working full time  Allergies  Mr. Suen has No Known Allergies.  Laboratory Chemistry  Inflammation Markers (CRP: Acute Phase) (ESR: Chronic Phase) Lab Results  Component Value Date   CRP 0.6 09/05/2013   ESRSEDRATE 6 09/05/2013                 Rheumatology Markers Lab Results  Component Value Date   ANA Negative 09/05/2013                Renal  Function Markers Lab Results  Component Value Date   BUN 13 04/18/2015   CREATININE 1.21 04/18/2015   GFRAA 80 04/18/2015   GFRNONAA 69 04/18/2015                 Hepatic Function Markers Lab Results  Component Value Date   AST 34 04/18/2015   ALT 38 04/18/2015   ALBUMIN 4.5 04/18/2015   ALKPHOS 58 04/18/2015                 Electrolytes Lab Results  Component Value Date   NA 143 04/18/2015   K 4.6 04/18/2015   CL 100 04/18/2015   CALCIUM 9.3 04/18/2015                 Neuropathy Markers No results found for: VITAMINB12, FOLATE, HGBA1C, HIV               Bone Pathology Markers No results found for: [place], [id number], [id number], [id number], 25OHVITD1, 25OHVITD2, 25OHVITD3, TESTOFREE, TESTOSTERONE               Coagulation Parameters No results found for: INR, LABPROT, APTT, PLT, DDIMER               Cardiovascular Markers Lab Results  Component Value Date   CKTOTAL 269 (H) 09/05/2013                 CA Markers No results found for: CEA, CA125, LABCA2               Note: Lab results reviewed.  PFSH  Drug: Mr. Kuenzel  reports that he does not use drugs. Alcohol:  reports that he does not drink alcohol. Tobacco:  reports that  has never smoked. he has never used smokeless tobacco. Medical:  has a past medical history of  Hyperlipidemia and Nerve pain. Family: family history includes Alzheimer's disease in his father; Cancer in his mother and unknown relative; Diabetes in his unknown relative; Mental illness in his unknown relative.  Past Surgical History:  Procedure Laterality Date  . ANKLE SURGERY    . HERNIA REPAIR  2000   bilinguial hernia  . KNEE SURGERY     Active Ambulatory Problems    Diagnosis Date Noted  . Paresthesia 09/05/2013  . Chronic myofascial pain 12/19/2014  . Neuropathic pain 12/19/2014  . Clinical depression 12/19/2014  . Continuous opioid dependence ([place]) 12/19/2014  . Dyslipidemia 12/19/2014  . Pain in left elbow 02/15/2015  . Sweating abnormality 03/19/2015  . Sensation of fullness in left ear 05/16/2015  . Allergic rhinitis 05/16/2015  . Hyperlipidemia 12/25/2016   Resolved Ambulatory Problems    Diagnosis Date Noted  . Pain in limb 09/05/2013  . Long term current use of opiate analgesic 12/19/2014  . Screening for gout 12/19/2014   Past Medical History:  Diagnosis Date  . Hyperlipidemia   . Nerve pain    Constitutional Exam  General appearance: Well nourished, well developed, and well hydrated. In no apparent acute distress Vitals:   06/18/17 0857  BP: (!) 156/102  Pulse: 72  Resp: 16  Temp: 97.8 F (36.6 C)  TempSrc: Oral  SpO2: 100%  Weight: 168 lb (76.2 kg)  Height: _0  (1.676 m)   BMI Assessment: Estimated body mass index is 27.12 kg/m as calculated from the following:   Height as of this encounter: _1  (1.676 m).   Weight as of this encounter: 168 lb (76.2 kg).  BMI interpretation table: BMI level Category  Range association with higher incidence of chronic pain  <18 kg/m2 Underweight   18.5-24.9 kg/m2 Ideal body weight   25-29.9 kg/m2 Overweight Increased incidence by 20%  30-34.9 kg/m2 Obese (Class I) Increased incidence by 68%  35-39.9 kg/m2 Severe obesity (Class II) Increased incidence by 136%  >40 kg/m2 Extreme obesity (Class III)  Increased incidence by 254%   BMI Readings from Last 4 Encounters:  06/18/17 27.12 kg/m  06/03/17 28.37 kg/m  05/05/17 27.91 kg/m  02/24/17 27.97 kg/m   Wt Readings from Last 4 Encounters:  06/18/17 168 lb (76.2 kg)  06/03/17 170 lb 8 oz (77.3 kg)  05/05/17 167 lb 11.2 oz (76.1 kg)  02/24/17 168 lb 1.6 oz (76.2 kg)  Psych/Mental status: Alert, oriented x 3 (person, place, & time)       Eyes: PERLA Respiratory: No evidence of acute respiratory distress  Cervical Spine Area Exam  Skin & Axial Inspection: No masses, redness, edema, swelling, or associated skin lesions Alignment: Symmetrical Functional ROM: Unrestricted ROM      Stability: No instability detected Muscle Tone/Strength: Functionally intact. No obvious neuro-muscular anomalies detected. Sensory (Neurological): Unimpaired Palpation: No palpable anomalies              Upper Extremity (UE) Exam    Side: Right upper extremity  Side: Left upper extremity  Skin & Extremity Inspection: Skin color, temperature, and hair growth are WNL. No peripheral edema or cyanosis. No masses, redness, swelling, asymmetry, or associated skin lesions. No contractures.  Skin & Extremity Inspection: Skin color, temperature, and hair growth are WNL. No peripheral edema or cyanosis. No masses, redness, swelling, asymmetry, or associated skin lesions. No contractures.  Functional ROM: Unrestricted ROM          Functional ROM: Unrestricted ROM          Muscle Tone/Strength: Functionally intact. No obvious neuro-muscular anomalies detected.  Muscle Tone/Strength: Functionally intact. No obvious neuro-muscular anomalies detected.  Sensory (Neurological): Unimpaired          Sensory (Neurological): Unimpaired          Palpation: No palpable anomalies              Palpation: No palpable anomalies              Specialized Test(s): Deferred         Specialized Test(s): Deferred          Thoracic Spine Area Exam  Skin & Axial Inspection: No masses,  redness, or swelling Alignment: Symmetrical Functional ROM: Unrestricted ROM Stability: No instability detected Muscle Tone/Strength: Functionally intact. No obvious neuro-muscular anomalies detected. Sensory (Neurological): Movement associated pain Muscle strength & Tone: No palpable anomalies  Lumbar Spine Area Exam  Skin & Axial Inspection: No masses, redness, or swelling Alignment: Symmetrical Functional ROM: Unrestricted ROM      Stability: No instability detected Muscle Tone/Strength: Functionally intact. No obvious neuro-muscular anomalies detected. Sensory (Neurological): Unimpaired Palpation: No palpable anomalies       Provocative Tests: Lumbar Hyperextension and rotation test: Positive bilaterally for facet joint pain. Lumbar Lateral bending test: Positive due to pain. Patrick's Maneuver: evaluation deferred today                    Gait & Posture Assessment  Ambulation: Unassisted Gait: Relatively normal for age and body habitus Posture: WNL   Lower Extremity Exam    Side: Right lower extremity  Side: Left lower extremity  Skin & Extremity Inspection: Skin color, temperature, and  hair growth are WNL. No peripheral edema or cyanosis. No masses, redness, swelling, asymmetry, or associated skin lesions. No contractures.  Skin & Extremity Inspection: Skin color, temperature, and hair growth are WNL. No peripheral edema or cyanosis. No masses, redness, swelling, asymmetry, or associated skin lesions. No contractures.  Functional ROM: Unrestricted ROM          Functional ROM: Unrestricted ROM          Muscle Tone/Strength: Functionally intact. No obvious neuro-muscular anomalies detected.  Muscle Tone/Strength: Functionally intact. No obvious neuro-muscular anomalies detected.  Sensory (Neurological): Unimpaired  Sensory (Neurological): Unimpaired  Palpation: No palpable anomalies  Palpation: No palpable anomalies   Assessment  Primary Diagnosis & Pertinent Problem List: The  primary encounter diagnosis was Chronic myofascial pain. Diagnoses of Drug-seeking behavior, Recurrent major depressive disorder, remission status unspecified ([place]), and Chronic bilateral low back pain without sciatica were also pertinent to this visit.  Visit Diagnosis (New problems to examiner): 1. Chronic myofascial pain   2. Drug-seeking behavior   3. Recurrent major depressive disorder, remission status unspecified ([place])   4. Chronic bilateral low back pain without sciatica     Plan of Care (Initial workup plan)  General Recommendations: The pain condition that the patient suffers from is best treated with a multidisciplinary approach that involves an increase in physical activity to prevent de-conditioning and worsening of the pain cycle, as well as psychological counseling (formal and/or informal) to address the co-morbid psychological affects of pain. Treatment will often involve judicious use of pain medications and interventional procedures to decrease the pain, allowing the patient to participate in the physical activity that will ultimately produce long-lasting pain reductions. The goal of the multidisciplinary approach is to return the patient to a higher level of overall function and to restore their ability to perform activities of daily living.   51 year old male who presents with chronic pain syndrome secondary to what he states is "neuropathy".  He is referred from his primary care physician.  He has been on chronic opioid therapy for his chronic neuropathic pain and chronic myofascial pain.  He takes Opana 25 mg in the morning and 20 mg in the evening.  I had an extensive discussion with the patient about his medication regimen and the lack of diagnostic information to support his stated diagnoses.  I also have concerns about the patient's medication regimen as well as his home situation.  I informed the patient that chronic opioid therapy is not an effective solution for neuropathic  pain or myofascial pain syndrome.  Furthermore there is no diagnostic imaging in the medical record to support any pathology of his cervical, thoracic, lumbar spine.  Patient does have an x-ray of his thoracic spine from 2002 that shows degenerative changes which are only mild of his thoracic spine, not explaining his diffuse widespread pain symptoms.  Furthermore the patient was unable to elaborate on his previous treatments which includes dosages of medications prescribed.  When I advised him that he would not be a candidate for opioid therapy at our clinic given lack of definitive diagnostic information to support his claim of "neuropathy" his wife became somewhat verbally aggressive.  She kept probing me as to what her alternatives would be and that he needs these medications to be active.  I am concerned about medication dependence in this patient.  I told him that I would need further diagnostic information before I considered opioid therapy as a part of his treatment plan.  Since the patient  states that he has had multiple workups in the past including nerve conduction studies, MRI, CT scans, I told him that he needs to provide all the information so that his treatment plan can be further thought about.  After this, the wife interjected and stated "those treatment studies did not show anything."  When I told him that if this was the case, my recommendation would be to wean off of his chronic opioid therapy, the patient as well as his wife became frustrated and verbally aggressive.  I told him that chronic opioid therapy is not a treatment of choice for chronic neuropathic pain or myofascial pain syndrome especially in the absence of any documented pathology in the form of nerve conduction studies, EMG studies, radiographic studies.  At this point I do not recommend opioid therapy for this patient unless he is able to provide supportive diagnostic information from what he states are test that he has had in the  past.  At this point I am concerned that the patient and/or his wife are not telling me the truth about his presumed diagnosis of "neuropathy" since there is discrepancy in their statements.  Initially the patient and his wife stated that these medications were for neuropathy and when I probed them about diagnostic tests that they had done in the past and that I would need records, they stated that the test did not show anything conclusive.  If this is indeed the case, my recommendation again is to wean off these medications.  The patient's wife became particularly irate with me when I stated that we had alternative therapies including non-opioid analgesics as well as psychotherapy to assist as he weaned off his opioid medications.  She stated that "your only recommendation is to see a therapist" and to that I explained the importance of multimodal multidisciplinary chronic pain management especially in the absence of any identifiable pathology when the patient is stating that they have had extensive workup performed.  I told him that I would not continue to merely prescribe him opioid medications just because this is been done in the past.  Furthermore I recommend the patient wean off of his chronic opioid therapy as I do not see a significant identifiable diagnosis other than chronic myofascial pain syndrome or some sort of atypical peripheral neuropathy which I will need to see records  by neurologist.  Patient is welcome to follow-up with me as needed to discuss non-opioid based therapy.  I I am happy to discuss opioid based therapy with him if his diagnostic information that he provides is consistent with a severe pathologic process which upon my exam and evaluation today I did not see this to be the case.  Furthermore the patient is on high-dose opioid therapy which exceeds an MME of greater than 130.  Our clinic cut off is 79 MME and this is another reason that the patient would not be a suitable candidate  for chronic opioid therapy at our clinic in addition to the fact that there is no clear identifiable diagnosis in regards to his claimed "neuropathic pain".  Furthermore I am concerned about the wife's dysfunctional emotional process and and her focus on opioid therapy during his clinic visit.  I had  an extensive discussion with the patient about the importance of physical therapy and focusing on non-opioid analgesics but the patient was not interested.  At this point based upon my interaction with the patient as well as the verbally aggressive interaction with his wife and the  lack of diagnostic information to support his need to be on chronic opioid therapy, I do not feel that chronic opioid therapy is in his best interest.    Future Appointments  Date Time Provider [place]  07/02/2017  8:40 AM Roselee Nova, MD [facility]  08/04/2017  9:00 AM Venia Carbon, MD [facility]    Primary Care Physician: Roselee Nova, MD Location: [facility] Note by: Gillis Santa, M.D, Date: 06/18/2017; Time: 12:08 PM  There are no Patient Instructions on file for this visit.

## 2017-06-24 ENCOUNTER — Other Ambulatory Visit: Payer: Self-pay

## 2017-06-24 ENCOUNTER — Ambulatory Visit: Payer: BLUE CROSS/BLUE SHIELD | Admitting: Family Medicine

## 2017-06-24 ENCOUNTER — Encounter: Payer: Self-pay | Admitting: Family Medicine

## 2017-06-24 ENCOUNTER — Telehealth: Payer: Self-pay

## 2017-06-24 VITALS — BP 146/84 | HR 78 | Temp 98.2°F | Resp 14 | Wt 169.5 lb

## 2017-06-24 DIAGNOSIS — G8929 Other chronic pain: Secondary | ICD-10-CM | POA: Diagnosis not present

## 2017-06-24 DIAGNOSIS — M7918 Myalgia, other site: Secondary | ICD-10-CM

## 2017-06-24 DIAGNOSIS — M792 Neuralgia and neuritis, unspecified: Secondary | ICD-10-CM

## 2017-06-24 MED ORDER — OXYMORPHONE HCL ER 15 MG PO T12A: 1.0000 | EXTENDED_RELEASE_TABLET | ORAL | 0 refills | Status: DC

## 2017-06-24 MED ORDER — OXYMORPHONE HCL ER 10 MG PO T12A: 10.0000 mg | EXTENDED_RELEASE_TABLET | Freq: Every evening | ORAL | 0 refills | Status: DC

## 2017-06-24 MED ORDER — GABAPENTIN 600 MG PO TABS: 300.0000 mg | ORAL_TABLET | Freq: Two times a day (BID) | ORAL | 0 refills | Status: DC

## 2017-06-24 NOTE — Telephone Encounter (Signed)
Dr.s hah asked me to call patient to see what questions and concerns he has regarding his pain meds due to Dr. Sherryll BurgerShah not able to prescribe any more pain meds and Benzodiazepines. Patient states that he saw Dr. Cherylann RatelLateef and he was rude to his wife as well as to him. Pt states the Doctor stated that there is nothing in his chart that shows that he is needed for pain meds and if he was it wouldn't be the same amount. I offer to put in referral to somewhere of his choice and or something our referral coordinator and I can find; pt denied and asking to still come in to speak to Dr. Sherryll BurgerShah. Pt states he want to talk to Dr. Sherryll BurgerShah about options and another referral to someone who is not rude. Pt states that he did talk to a pain clinic in [place] [facility] Dr. Tollie EthPlummer  but With a referral it would be 3 months out. Pt states he has not tried any other places. Pt is asking for a plan to help him. Pt has 8-10 days left of his meds.  [facility] referral [facility] to see if we can come up with a list to help the pt out. Pt understood that Dr. Sherryll BurgerShah is not able to prescribed any pain meds.

## 2017-06-24 NOTE — Progress Notes (Signed)
Name: Hector Gonzalez   MRN: [id number]    DOB: 12-26-1965   Date:06/24/2017       Progress Note  Subjective  Chief Complaint  Chief Complaint  Patient presents with  . consult about pain mangement  . Medication Refill    Gabapentin    HPI  Pt. Presents for follow up after seeing Pain Management provider where he was referred to for chronic myofascial pain, he comes in and tells me that the Pain Management provider (Dr. Cherylann Ratel) was very rude to him and his wife and that he refused to provide any pain medication or to even taper him down. He has since been referred to Dr. Ronita Hipps and will call to make an appointment. In the meantime, he has started tapering down the dosage of Opana to 20 mg in AM and 10 mg in PM. He is here to get further instructions on what to do next if he is not able to see the next pain management provider in time. He reports no withdrawal symptoms at this time.  His pain is rated at 2/10 today, initially it was worse with 'stress' but now its improved, has been taking Sertraline and gabapentin as prescribed. He experiences pain throughout his body but mostly in his back and radiating down the thighs. He has history of chronic neuropathic/myofascial pain and has had initial workup performed at [facility] along with the pain clinic that he used to follow at that time   Past Medical History:  Diagnosis Date  . Hyperlipidemia   . Nerve pain     Past Surgical History:  Procedure Laterality Date  . ANKLE SURGERY    . HERNIA REPAIR  2000   bilinguial hernia  . KNEE SURGERY      Family History  Problem Relation Age of Onset  . Cancer Mother   . Alzheimer's disease Father   . Cancer Unknown   . Diabetes Unknown   . Mental illness Unknown     Social History   Socioeconomic History  . Marital status: Single    Spouse name: Not on file  . Number of children: 1  . Years of education: 53  . Highest education level: Not on file  Social Needs  . Financial  resource strain: Not on file  . Food insecurity - worry: Not on file  . Food insecurity - inability: Not on file  . Transportation needs - medical: Not on file  . Transportation needs - non-medical: Not on file  Occupational History    Comment: [employer]  Tobacco Use  . Smoking status: Never Smoker  . Smokeless tobacco: Never Used  Substance and Sexual Activity  . Alcohol use: No  . Drug use: No  . Sexual activity: Yes  Other Topics Concern  . Not on file  Social History Narrative   Patients lives at home with his wife [name] and works full time for [employer] school education   Right handed   Caffeine quit      Current Outpatient Medications:  .  b complex vitamins capsule, Take 1 capsule by mouth daily., Disp: , Rfl:  .  Calcium Polycarbophil 625 MG CHEW, Chew 1 tablet by mouth daily., Disp: , Rfl:  .  gabapentin (NEURONTIN) 600 MG tablet, TAKE ONE TABLET BY MOUTH EVERY DAY (Patient taking differently: TAKE ONE TABLET BY MOUTH EVERY DAY  (patient takes 300 mg in the morning and 300 mg qhs)), Disp: 90 tablet, Rfl: 0 .  glucosamine-chondroitin 500-400 MG tablet, Take 1 tablet by mouth daily., Disp: , Rfl:  .  ibuprofen (ADVIL,MOTRIN) 200 MG tablet, Take 200 mg by mouth every 6 (six) hours as needed., Disp: , Rfl:  .  oxymorphone (OPANA ER) 20 MG 12 hr tablet, Take 1 tablet (20 mg total) by mouth every 12 (twelve) hours., Disp: 60 tablet, Rfl: 0 .  oxymorphone (OPANA ER) 5 MG 12 hr tablet, Take 1 tablet (5 mg total) by mouth every morning., Disp: 30 tablet, Rfl: 0 .  sertraline (ZOLOFT) 100 MG tablet, Take 1 tablet (100 mg total) daily by mouth., Disp: 90 tablet, Rfl: 0 .  fluticasone (FLONASE) 50 MCG/ACT nasal spray, Place 2 sprays into both nostrils daily. (Patient not taking: Reported on 02/24/2017), Disp: 16 g, Rfl: 0  No Known Allergies   ROS  Please see HPI for complete discussion of ROS.   Objective  Vitals:   06/24/17 1320  BP: (!) 146/84  Pulse: 78   Resp: 14  Temp: 98.2 F (36.8 C)  TempSrc: Oral  SpO2: 99%  Weight: 169 lb 8 oz (76.9 kg)    Physical Exam  Constitutional: He is oriented to person, place, and time and well-developed, well-nourished, and in no distress.  HENT:  Head: Normocephalic and atraumatic.  Cardiovascular: Normal rate, regular rhythm and normal heart sounds.  No murmur heard. Pulmonary/Chest: Effort normal and breath sounds normal. He has no wheezes.  Neurological: He is alert and oriented to person, place, and time.  Psychiatric: Mood, memory, affect and judgment normal.  Nursing note and vitals reviewed.    Assessment & Plan  1. Neuropathic pain  patient has already started tapering down Opana from his usual dosage of 20 mg twice a day +5 mg in a.m. to taking 20 mg in the morning and 15 mg at night, he is worried about withdrawal symptoms if he runs out of medication and cannot find a pain doctor willing to write his prescriptions. We have made an new referral to a pain specialist in [place] along with the local pain specialist is Dr. Ronita HippsFarooque Khan, will decrease the dosage of Opana to 15 mg in the morning and 10 mg at night, I have provided him a 30 day supply for his medications and have emphasized that he must find a new pain doctor within the next 30 days. So far, he hasn't no withdrawal symptoms, blood pressure is elevated but that is most likely because of getting irritated at the pain management provider and anxiety. - Oxymorphone ER, Crush Resist, (OPANA ER, CRUSH RESISTANT,) 15 MG PO T12A; Take 1 tablet by mouth every morning.  Dispense: 30 tablet; Refill: 0 - oxymorphone (OPANA ER, CRUSH RESISTANT,) 10 MG PO T12A 12 hr tablet; Take 1 tablet (10 mg total) by mouth every evening.  Dispense: 30 tablet; Refill: 0 - Ambulatory referral to Pain Clinic  2. Chronic myofascial pain  - Oxymorphone ER, Crush Resist, (OPANA ER, CRUSH RESISTANT,) 15 MG PO T12A; Take 1 tablet by mouth every morning.   Dispense: 30 tablet; Refill: 0 - oxymorphone (OPANA ER, CRUSH RESISTANT,) 10 MG PO T12A 12 hr tablet; Take 1 tablet (10 mg total) by mouth every evening.  Dispense: 30 tablet; Refill: 0 - Ambulatory referral to Pain Clinic   [facility]. Faylene KurtzShah [facility] 06/24/2017 1:55 PM

## 2017-06-24 NOTE — Telephone Encounter (Signed)
Pt came back in and asked if he can get this refill. We spoke about this verbally but he stating he will need this just in case.

## 2017-07-02 ENCOUNTER — Encounter: Payer: BLUE CROSS/BLUE SHIELD | Admitting: Family Medicine

## 2017-07-20 DIAGNOSIS — F411 Generalized anxiety disorder: Secondary | ICD-10-CM | POA: Diagnosis not present

## 2017-07-20 DIAGNOSIS — R030 Elevated blood-pressure reading, without diagnosis of hypertension: Secondary | ICD-10-CM | POA: Diagnosis not present

## 2017-07-20 DIAGNOSIS — G609 Hereditary and idiopathic neuropathy, unspecified: Secondary | ICD-10-CM | POA: Diagnosis not present

## 2017-07-22 ENCOUNTER — Telehealth: Payer: Self-pay | Admitting: Family Medicine

## 2017-07-22 NOTE — Telephone Encounter (Signed)
Copied from CRM [id number]. Topic: Quick Communication - See Telephone Encounter >> Jul 22, 2017  4:17 PM Louie BunPalacios Medina, Rosey Batheresa D wrote: CRM for notification. See Telephone encounter for: 07/22/17. Latania from [facility] called and wanted to give provider status of patient. Patient is not a candidate of chronic opioid therapy because of lack clinical documentation for medical necessity. She can be reached at [phone].

## 2017-07-23 NOTE — Telephone Encounter (Signed)
Patient informed me that he has records from the early days of pain therapy when he was started on opioid [facility], he can be referred to a different provider for evaluation.

## 2017-07-23 NOTE — Telephone Encounter (Signed)
The requested information has been sent to: [facility] P: [phone] F: [phone]  Release ID #[id number]

## 2017-07-29 DIAGNOSIS — F1120 Opioid dependence, uncomplicated: Secondary | ICD-10-CM | POA: Diagnosis not present

## 2017-07-29 DIAGNOSIS — G629 Polyneuropathy, unspecified: Secondary | ICD-10-CM | POA: Diagnosis not present

## 2017-07-29 DIAGNOSIS — M545 Low back pain: Secondary | ICD-10-CM | POA: Diagnosis not present

## 2017-07-29 DIAGNOSIS — G8929 Other chronic pain: Secondary | ICD-10-CM | POA: Diagnosis not present

## 2017-08-04 ENCOUNTER — Ambulatory Visit: Payer: BLUE CROSS/BLUE SHIELD | Admitting: Internal Medicine

## 2017-08-26 DIAGNOSIS — G894 Chronic pain syndrome: Secondary | ICD-10-CM | POA: Diagnosis not present

## 2017-08-26 DIAGNOSIS — G8929 Other chronic pain: Secondary | ICD-10-CM | POA: Diagnosis not present

## 2017-08-26 DIAGNOSIS — M545 Low back pain: Secondary | ICD-10-CM | POA: Diagnosis not present

## 2017-08-26 DIAGNOSIS — F1120 Opioid dependence, uncomplicated: Secondary | ICD-10-CM | POA: Diagnosis not present

## 2017-09-23 DIAGNOSIS — G894 Chronic pain syndrome: Secondary | ICD-10-CM | POA: Diagnosis not present

## 2017-09-23 DIAGNOSIS — G629 Polyneuropathy, unspecified: Secondary | ICD-10-CM | POA: Diagnosis not present

## 2017-09-23 DIAGNOSIS — F1120 Opioid dependence, uncomplicated: Secondary | ICD-10-CM | POA: Diagnosis not present

## 2017-09-23 DIAGNOSIS — M545 Low back pain: Secondary | ICD-10-CM | POA: Diagnosis not present

## 2017-10-08 DIAGNOSIS — F411 Generalized anxiety disorder: Secondary | ICD-10-CM | POA: Diagnosis not present

## 2017-10-08 DIAGNOSIS — H6505 Acute serous otitis media, recurrent, left ear: Secondary | ICD-10-CM | POA: Diagnosis not present

## 2017-10-26 DIAGNOSIS — G894 Chronic pain syndrome: Secondary | ICD-10-CM | POA: Diagnosis not present

## 2017-10-26 DIAGNOSIS — M545 Low back pain: Secondary | ICD-10-CM | POA: Diagnosis not present

## 2017-10-26 DIAGNOSIS — G629 Polyneuropathy, unspecified: Secondary | ICD-10-CM | POA: Diagnosis not present

## 2017-10-26 DIAGNOSIS — F1120 Opioid dependence, uncomplicated: Secondary | ICD-10-CM | POA: Diagnosis not present

## 2017-11-09 DIAGNOSIS — M1711 Unilateral primary osteoarthritis, right knee: Secondary | ICD-10-CM | POA: Diagnosis not present

## 2017-11-09 DIAGNOSIS — M25561 Pain in right knee: Secondary | ICD-10-CM | POA: Diagnosis not present

## 2017-11-23 DIAGNOSIS — F1120 Opioid dependence, uncomplicated: Secondary | ICD-10-CM | POA: Diagnosis not present

## 2017-11-23 DIAGNOSIS — M545 Low back pain: Secondary | ICD-10-CM | POA: Diagnosis not present

## 2017-11-23 DIAGNOSIS — G629 Polyneuropathy, unspecified: Secondary | ICD-10-CM | POA: Diagnosis not present

## 2017-11-23 DIAGNOSIS — G8929 Other chronic pain: Secondary | ICD-10-CM | POA: Diagnosis not present

## 2017-11-26 DIAGNOSIS — M1711 Unilateral primary osteoarthritis, right knee: Secondary | ICD-10-CM | POA: Diagnosis not present

## 2017-11-26 DIAGNOSIS — M25761 Osteophyte, right knee: Secondary | ICD-10-CM | POA: Diagnosis not present

## 2017-11-26 DIAGNOSIS — M25561 Pain in right knee: Secondary | ICD-10-CM | POA: Diagnosis not present

## 2017-11-26 DIAGNOSIS — G8929 Other chronic pain: Secondary | ICD-10-CM | POA: Diagnosis not present

## 2017-11-26 DIAGNOSIS — M21061 Valgus deformity, not elsewhere classified, right knee: Secondary | ICD-10-CM | POA: Diagnosis not present

## 2017-11-30 DIAGNOSIS — F411 Generalized anxiety disorder: Secondary | ICD-10-CM | POA: Diagnosis not present

## 2017-11-30 DIAGNOSIS — Z01818 Encounter for other preprocedural examination: Secondary | ICD-10-CM | POA: Diagnosis not present

## 2017-12-10 DIAGNOSIS — M1731 Unilateral post-traumatic osteoarthritis, right knee: Secondary | ICD-10-CM | POA: Diagnosis not present

## 2017-12-21 ENCOUNTER — Other Ambulatory Visit: Payer: Self-pay | Admitting: Orthopedic Surgery

## 2017-12-30 DIAGNOSIS — G629 Polyneuropathy, unspecified: Secondary | ICD-10-CM | POA: Diagnosis not present

## 2017-12-30 DIAGNOSIS — M545 Low back pain: Secondary | ICD-10-CM | POA: Diagnosis not present

## 2017-12-30 DIAGNOSIS — G894 Chronic pain syndrome: Secondary | ICD-10-CM | POA: Diagnosis not present

## 2017-12-30 DIAGNOSIS — F1120 Opioid dependence, uncomplicated: Secondary | ICD-10-CM | POA: Diagnosis not present

## 2018-01-07 ENCOUNTER — Encounter (HOSPITAL_COMMUNITY)
Admission: RE | Admit: 2018-01-07 | Discharge: 2018-01-07 | Disposition: A | Discharge status: Home / Self Care | Payer: BLUE CROSS/BLUE SHIELD | Source: Ambulatory Visit | Attending: Orthopedic Surgery | Admitting: Orthopedic Surgery

## 2018-01-07 ENCOUNTER — Encounter (HOSPITAL_COMMUNITY): Payer: Self-pay

## 2018-01-07 ENCOUNTER — Other Ambulatory Visit: Payer: Self-pay

## 2018-01-07 DIAGNOSIS — Z01812 Encounter for preprocedural laboratory examination: Secondary | ICD-10-CM | POA: Diagnosis not present

## 2018-01-07 LAB — CBC WITH DIFFERENTIAL/PLATELET
ABS IMMATURE GRANULOCYTES: 0 10*3/uL (ref 0.0–0.1)
Basophils Absolute: 0 10*3/uL (ref 0.0–0.1)
Basophils Relative: 1 %
Eosinophils Absolute: 0.1 10*3/uL (ref 0.0–0.7)
Eosinophils Relative: 1 %
HEMATOCRIT: 43.2 % (ref 39.0–52.0)
Hemoglobin: 14.8 g/dL (ref 13.0–17.0)
Immature Granulocytes: 0 %
LYMPHS ABS: 1.1 10*3/uL (ref 0.7–4.0)
Lymphocytes Relative: 25 %
MCH: 30.3 pg (ref 26.0–34.0)
MCHC: 34.3 g/dL (ref 30.0–36.0)
MCV: 88.5 fL (ref 78.0–100.0)
MONOS PCT: 8 %
Monocytes Absolute: 0.4 10*3/uL (ref 0.1–1.0)
NEUTROS ABS: 2.9 10*3/uL (ref 1.7–7.7)
NEUTROS PCT: 65 %
PLATELETS: 198 10*3/uL (ref 150–400)
RBC: 4.88 MIL/uL (ref 4.22–5.81)
RDW: 11.9 % (ref 11.5–15.5)
WBC: 4.5 10*3/uL (ref 4.0–10.5)

## 2018-01-07 LAB — COMPREHENSIVE METABOLIC PANEL
ALBUMIN: 4.1 g/dL (ref 3.5–5.0)
ALT: 33 U/L (ref 0–44)
AST: 26 U/L (ref 15–41)
Alkaline Phosphatase: 39 U/L (ref 38–126)
Anion gap: 9 (ref 5–15)
BILIRUBIN TOTAL: 1.5 mg/dL — AB (ref 0.3–1.2)
BUN: 14 mg/dL (ref 6–20)
CHLORIDE: 104 mmol/L (ref 98–111)
CO2: 26 mmol/L (ref 22–32)
CREATININE: 0.97 mg/dL (ref 0.61–1.24)
Calcium: 8.9 mg/dL (ref 8.9–10.3)
GFR calc Af Amer: >60 mL/min (ref >60)
GLUCOSE: 109 mg/dL — AB (ref 70–99)
Potassium: 3.6 mmol/L (ref 3.5–5.1)
Sodium: 139 mmol/L (ref 135–145)
Total Protein: 6.5 g/dL (ref 6.5–8.1)

## 2018-01-07 LAB — SURGICAL PCR SCREEN
MRSA, PCR: NEGATIVE
Staphylococcus aureus: NEGATIVE

## 2018-01-07 NOTE — Pre-Procedure Instructions (Signed)
Hector Gonzalez  01/07/2018      [facility] [place] - [address] [place] [address] Phone: [phone] Fax: [phone]    Your procedure is scheduled on July 29  [facility] at 0830 A.M.  Call this number if you have problems the morning of surgery:  [phone]   Remember:  Do not eat or drink after midnight.      Take these medicines the morning of surgery with A SIP OF WATER  busPIRone (BUSPAR) gabapentin (NEURONTIN)  OXYCONTIN   7 days prior to surgery STOP taking any Aspirin(unless otherwise instructed by your surgeon), Aleve, Naproxen, Ibuprofen, Motrin, Advil, Goody's, BC's, all herbal medications, fish oil, and all vitamins     Do not wear jewelry  Do not wear lotions, powders, or cologne, or deodorant.   Men may shave face and neck.  Do not bring valuables to the hospital.  [facility] is not responsible for any belongings or valuables.  Contacts, dentures or bridgework may not be worn into surgery.  Leave your suitcase in the car.  After surgery it may be brought to your room.  For patients admitted to the hospital, discharge time will be determined by your treatment team.  Patients discharged the day of surgery will not be allowed to drive home.    Special instructions:   [place]- Preparing For Surgery  Before surgery, you can play an important role. Because skin is not sterile, your skin needs to be as free of germs as possible. You can reduce the number of germs on your skin by washing with CHG (chlorahexidine gluconate) Soap before surgery.  CHG is an antiseptic cleaner which kills germs and bonds with the skin to continue killing germs even after washing.    Oral Hygiene is also important to reduce your risk of infection.  Remember - BRUSH YOUR TEETH THE MORNING OF SURGERY WITH YOUR REGULAR TOOTHPASTE  Please do not use if you have an allergy to CHG or antibacterial  soaps. If your skin becomes reddened/irritated stop using the CHG.  Do not shave (including legs and underarms) for at least 48 hours prior to first CHG shower. It is OK to shave your face.  Please follow these instructions carefully.   1. Shower the NIGHT BEFORE SURGERY and the MORNING OF SURGERY with CHG.   2. If you chose to wash your hair, wash your hair first as usual with your normal shampoo.  3. After you shampoo, rinse your hair and body thoroughly to remove the shampoo.  4. Use CHG as you would any other liquid soap. You can apply CHG directly to the skin and wash gently with a scrungie or a clean washcloth.   5. Apply the CHG Soap to your body ONLY FROM THE NECK DOWN.  Do not use on open wounds or open sores. Avoid contact with your eyes, ears, mouth and genitals (private parts). Wash Face and genitals (private parts)  with your normal soap.  6. Wash thoroughly, paying special attention to the area where your surgery will be performed.  7. Thoroughly rinse your body with warm water from the neck down.  8. DO NOT shower/wash with your normal soap after using and rinsing off the CHG Soap.  9. Pat yourself dry with a CLEAN TOWEL.  10. Wear CLEAN PAJAMAS to bed the night before surgery, wear comfortable clothes the morning of surgery  11.  Place CLEAN SHEETS on your bed the night of your first shower and DO NOT SLEEP WITH PETS.    Day of Surgery:  Do not apply any deodorants/lotions.  Please wear clean clothes to the hospital/surgery center.   Remember to brush your teeth WITH YOUR REGULAR TOOTHPASTE.    Please read over the following fact sheets that you were given.

## 2018-01-07 NOTE — Progress Notes (Signed)
PCP - Marisue IvanKanhka Linthavong Cardiologist - denies  Chest x-ray - not needed EKG - requesting from PCP Stress Test - denies ECHO - denies Cardiac Cath - denies   Anesthesia review: yes ekg that has been requested, clearance note in care everywhere  Patient denies shortness of breath, fever, cough and chest pain at PAT appointment   Patient verbalized understanding of instructions that were given to them at the PAT appointment. Patient was also instructed that they will need to review over the PAT instructions again at home before surgery.

## 2018-01-16 MED ORDER — DEXAMETHASONE SODIUM PHOSPHATE 10 MG/ML IJ SOLN
8.0000 mg | INTRAMUSCULAR | Status: AC
  Administered 2018-01-18: 8 mg via INTRAVENOUS
  Filled 2018-01-16: qty 1

## 2018-01-16 MED ORDER — CEFAZOLIN SODIUM-DEXTROSE 2-4 GM/100ML-% IV SOLN
2.0000 g | INTRAVENOUS | Status: AC
  Administered 2018-01-18: 2 g via INTRAVENOUS
  Filled 2018-01-16: qty 100

## 2018-01-16 MED ORDER — ACETAMINOPHEN 500 MG PO TABS
1000.0000 mg | ORAL_TABLET | Freq: Once | ORAL | Status: AC
  Administered 2018-01-18: 1000 mg via ORAL
  Filled 2018-01-16: qty 2

## 2018-01-16 MED ORDER — GABAPENTIN 300 MG PO CAPS
300.0000 mg | ORAL_CAPSULE | Freq: Once | ORAL | Status: DC
  Filled 2018-01-16: qty 1

## 2018-01-16 MED ORDER — TRANEXAMIC ACID 1000 MG/10ML IV SOLN
1000.0000 mg | INTRAVENOUS | Status: AC
  Administered 2018-01-18: 1000 mg via INTRAVENOUS
  Filled 2018-01-16: qty 1100

## 2018-01-16 MED ORDER — BUPIVACAINE LIPOSOME 1.3 % IJ SUSP
20.0000 mL | INTRAMUSCULAR | Status: DC
  Filled 2018-01-16: qty 20

## 2018-01-18 ENCOUNTER — Other Ambulatory Visit: Payer: Self-pay

## 2018-01-18 ENCOUNTER — Encounter (HOSPITAL_COMMUNITY)
Admission: RE | Disposition: A | Discharge status: Home / Self Care | Payer: Self-pay | Source: Ambulatory Visit | Attending: Orthopedic Surgery

## 2018-01-18 ENCOUNTER — Encounter (HOSPITAL_COMMUNITY): Payer: Self-pay | Admitting: *Deleted

## 2018-01-18 ENCOUNTER — Ambulatory Visit (HOSPITAL_COMMUNITY): Payer: BLUE CROSS/BLUE SHIELD | Admitting: Certified Registered"

## 2018-01-18 ENCOUNTER — Observation Stay (HOSPITAL_COMMUNITY)
Admission: RE | Admit: 2018-01-18 | Discharge: 2018-01-20 | Disposition: A | Discharge status: Home / Self Care | Payer: BLUE CROSS/BLUE SHIELD | Source: Ambulatory Visit | Attending: Orthopedic Surgery | Admitting: Orthopedic Surgery

## 2018-01-18 ENCOUNTER — Ambulatory Visit (HOSPITAL_COMMUNITY): Payer: BLUE CROSS/BLUE SHIELD | Admitting: Emergency Medicine

## 2018-01-18 DIAGNOSIS — E785 Hyperlipidemia, unspecified: Secondary | ICD-10-CM | POA: Diagnosis not present

## 2018-01-18 DIAGNOSIS — Z79899 Other long term (current) drug therapy: Secondary | ICD-10-CM | POA: Insufficient documentation

## 2018-01-18 DIAGNOSIS — R262 Difficulty in walking, not elsewhere classified: Secondary | ICD-10-CM | POA: Diagnosis not present

## 2018-01-18 DIAGNOSIS — Z96659 Presence of unspecified artificial knee joint: Secondary | ICD-10-CM

## 2018-01-18 DIAGNOSIS — G8918 Other acute postprocedural pain: Secondary | ICD-10-CM | POA: Diagnosis not present

## 2018-01-18 DIAGNOSIS — F329 Major depressive disorder, single episode, unspecified: Secondary | ICD-10-CM | POA: Insufficient documentation

## 2018-01-18 DIAGNOSIS — Z7982 Long term (current) use of aspirin: Secondary | ICD-10-CM | POA: Insufficient documentation

## 2018-01-18 DIAGNOSIS — M1711 Unilateral primary osteoarthritis, right knee: Principal | ICD-10-CM | POA: Insufficient documentation

## 2018-01-18 HISTORY — PX: TOTAL KNEE ARTHROPLASTY: SHX125

## 2018-01-18 SURGERY — ARTHROPLASTY, KNEE, TOTAL
Anesthesia: Monitor Anesthesia Care | Site: Knee | Laterality: Right

## 2018-01-18 MED ORDER — CHLORHEXIDINE GLUCONATE 4 % EX LIQD: 60.0000 mL | Freq: Once | CUTANEOUS | Status: DC

## 2018-01-18 MED ORDER — BUPIVACAINE IN DEXTROSE 0.75-8.25 % IT SOLN
INTRATHECAL | Status: DC | PRN
  Administered 2018-01-18: 1.8 mL via INTRATHECAL

## 2018-01-18 MED ORDER — FENTANYL CITRATE (PF) 250 MCG/5ML IJ SOLN
INTRAMUSCULAR | Status: AC
  Filled 2018-01-18: qty 5

## 2018-01-18 MED ORDER — FLEET ENEMA 7-19 GM/118ML RE ENEM
1.0000 | ENEMA | Freq: Once | RECTAL | Status: DC | PRN
Start: 2018-01-18 — End: 2018-01-20

## 2018-01-18 MED ORDER — BUPIVACAINE LIPOSOME 1.3 % IJ SUSP
INTRAMUSCULAR | Status: DC | PRN
  Administered 2018-01-18: 20 mL

## 2018-01-18 MED ORDER — SODIUM CHLORIDE 0.9 % IJ SOLN
INTRAMUSCULAR | Status: DC | PRN
  Administered 2018-01-18: 20 mL

## 2018-01-18 MED ORDER — BUPIVACAINE HCL (PF) 0.75 % IJ SOLN: INTRAMUSCULAR | Status: DC | PRN

## 2018-01-18 MED ORDER — METHOCARBAMOL 1000 MG/10ML IJ SOLN
500.0000 mg | Freq: Four times a day (QID) | INTRAVENOUS | Status: DC | PRN
  Filled 2018-01-18: qty 5

## 2018-01-18 MED ORDER — HYDROMORPHONE HCL 1 MG/ML IJ SOLN
0.5000 mg | INTRAMUSCULAR | Status: DC | PRN
  Administered 2018-01-18 – 2018-01-20 (×8): 1 mg via INTRAVENOUS
  Filled 2018-01-18 (×8): qty 1

## 2018-01-18 MED ORDER — ALUM & MAG HYDROXIDE-SIMETH 200-200-20 MG/5ML PO SUSP: 30.0000 mL | ORAL | Status: DC | PRN

## 2018-01-18 MED ORDER — FENTANYL CITRATE (PF) 250 MCG/5ML IJ SOLN
INTRAMUSCULAR | Status: DC | PRN
  Administered 2018-01-18 (×2): 50 ug via INTRAVENOUS

## 2018-01-18 MED ORDER — BISACODYL 5 MG PO TBEC: 5.0000 mg | DELAYED_RELEASE_TABLET | Freq: Every day | ORAL | Status: DC | PRN

## 2018-01-18 MED ORDER — TRAMADOL HCL 50 MG PO TABS
50.0000 mg | ORAL_TABLET | Freq: Four times a day (QID) | ORAL | Status: DC
  Administered 2018-01-18 – 2018-01-20 (×8): 50 mg via ORAL
  Filled 2018-01-18 (×8): qty 1

## 2018-01-18 MED ORDER — ONDANSETRON HCL 4 MG PO TABS: 4.0000 mg | ORAL_TABLET | Freq: Four times a day (QID) | ORAL | Status: DC | PRN

## 2018-01-18 MED ORDER — PHENOL 1.4 % MT LIQD: 1.0000 | OROMUCOSAL | Status: DC | PRN

## 2018-01-18 MED ORDER — ZOLPIDEM TARTRATE 5 MG PO TABS
5.0000 mg | ORAL_TABLET | Freq: Every evening | ORAL | Status: DC | PRN
  Administered 2018-01-20: 5 mg via ORAL
  Filled 2018-01-18: qty 1

## 2018-01-18 MED ORDER — DIPHENHYDRAMINE HCL 12.5 MG/5ML PO ELIX: 12.5000 mg | ORAL_SOLUTION | ORAL | Status: DC | PRN

## 2018-01-18 MED ORDER — BUSPIRONE HCL 5 MG PO TABS
5.0000 mg | ORAL_TABLET | Freq: Two times a day (BID) | ORAL | Status: DC
  Administered 2018-01-18 – 2018-01-20 (×4): 5 mg via ORAL
  Filled 2018-01-18 (×4): qty 1

## 2018-01-18 MED ORDER — SENNOSIDES-DOCUSATE SODIUM 8.6-50 MG PO TABS: 1.0000 | ORAL_TABLET | Freq: Every evening | ORAL | Status: DC | PRN

## 2018-01-18 MED ORDER — ONDANSETRON HCL 4 MG/2ML IJ SOLN
INTRAMUSCULAR | Status: AC
  Filled 2018-01-18: qty 2

## 2018-01-18 MED ORDER — FENTANYL CITRATE (PF) 100 MCG/2ML IJ SOLN
INTRAMUSCULAR | Status: AC
  Filled 2018-01-18: qty 2

## 2018-01-18 MED ORDER — METHOCARBAMOL 500 MG PO TABS
500.0000 mg | ORAL_TABLET | Freq: Four times a day (QID) | ORAL | Status: DC | PRN
  Administered 2018-01-19 – 2018-01-20 (×5): 500 mg via ORAL
  Filled 2018-01-18 (×5): qty 1

## 2018-01-18 MED ORDER — FERROUS SULFATE 325 (65 FE) MG PO TABS
325.0000 mg | ORAL_TABLET | Freq: Three times a day (TID) | ORAL | Status: DC
  Administered 2018-01-18 – 2018-01-20 (×5): 325 mg via ORAL
  Filled 2018-01-18 (×5): qty 1

## 2018-01-18 MED ORDER — 0.9 % SODIUM CHLORIDE (POUR BTL) OPTIME
TOPICAL | Status: DC | PRN
  Administered 2018-01-18: 1000 mL

## 2018-01-18 MED ORDER — ROPIVACAINE HCL 7.5 MG/ML IJ SOLN
INTRAMUSCULAR | Status: DC | PRN
  Administered 2018-01-18: 20 mL via PERINEURAL

## 2018-01-18 MED ORDER — MIDAZOLAM HCL 2 MG/2ML IJ SOLN
INTRAMUSCULAR | Status: AC
  Filled 2018-01-18: qty 2

## 2018-01-18 MED ORDER — METOCLOPRAMIDE HCL 5 MG PO TABS: 5.0000 mg | ORAL_TABLET | Freq: Three times a day (TID) | ORAL | Status: DC | PRN

## 2018-01-18 MED ORDER — LACTATED RINGERS IV SOLN
INTRAVENOUS | Status: DC
  Administered 2018-01-18: 08:00:00 via INTRAVENOUS

## 2018-01-18 MED ORDER — PANTOPRAZOLE SODIUM 40 MG PO TBEC
40.0000 mg | DELAYED_RELEASE_TABLET | Freq: Every day | ORAL | Status: DC
  Administered 2018-01-19 – 2018-01-20 (×2): 40 mg via ORAL
  Filled 2018-01-18 (×2): qty 1

## 2018-01-18 MED ORDER — GABAPENTIN 300 MG PO CAPS
600.0000 mg | ORAL_CAPSULE | Freq: Three times a day (TID) | ORAL | Status: DC
  Administered 2018-01-18 – 2018-01-20 (×6): 600 mg via ORAL
  Filled 2018-01-18 (×6): qty 2

## 2018-01-18 MED ORDER — CEFAZOLIN SODIUM-DEXTROSE 2-4 GM/100ML-% IV SOLN
2.0000 g | Freq: Four times a day (QID) | INTRAVENOUS | Status: AC
  Administered 2018-01-18 (×2): 2 g via INTRAVENOUS
  Filled 2018-01-18 (×2): qty 100

## 2018-01-18 MED ORDER — TRANEXAMIC ACID 1000 MG/10ML IV SOLN
1000.0000 mg | Freq: Once | INTRAVENOUS | Status: AC
  Administered 2018-01-18: 1000 mg via INTRAVENOUS
  Filled 2018-01-18 (×2): qty 10

## 2018-01-18 MED ORDER — OXYCODONE HCL 5 MG PO TABS
10.0000 mg | ORAL_TABLET | ORAL | Status: DC | PRN
  Administered 2018-01-18: 10 mg via ORAL
  Administered 2018-01-19 – 2018-01-20 (×4): 15 mg via ORAL
  Filled 2018-01-18: qty 3
  Filled 2018-01-18: qty 2
  Filled 2018-01-18 (×3): qty 3

## 2018-01-18 MED ORDER — ASPIRIN EC 325 MG PO TBEC
325.0000 mg | DELAYED_RELEASE_TABLET | Freq: Two times a day (BID) | ORAL | Status: DC
  Administered 2018-01-18 – 2018-01-20 (×4): 325 mg via ORAL
  Filled 2018-01-18 (×4): qty 1

## 2018-01-18 MED ORDER — FENTANYL CITRATE (PF) 100 MCG/2ML IJ SOLN: 25.0000 ug | INTRAMUSCULAR | Status: DC | PRN

## 2018-01-18 MED ORDER — ONDANSETRON HCL 4 MG/2ML IJ SOLN: 4.0000 mg | Freq: Four times a day (QID) | INTRAMUSCULAR | Status: DC | PRN

## 2018-01-18 MED ORDER — OXYCODONE HCL ER 10 MG PO T12A
10.0000 mg | EXTENDED_RELEASE_TABLET | Freq: Two times a day (BID) | ORAL | Status: DC
  Administered 2018-01-18 – 2018-01-20 (×4): 10 mg via ORAL
  Filled 2018-01-18 (×4): qty 1

## 2018-01-18 MED ORDER — ACETAMINOPHEN 500 MG PO TABS
1000.0000 mg | ORAL_TABLET | Freq: Four times a day (QID) | ORAL | Status: AC
  Administered 2018-01-18 – 2018-01-19 (×3): 1000 mg via ORAL
  Filled 2018-01-18 (×3): qty 2

## 2018-01-18 MED ORDER — BUPIVACAINE-EPINEPHRINE (PF) 0.25% -1:200000 IJ SOLN
INTRAMUSCULAR | Status: AC
  Filled 2018-01-18: qty 30

## 2018-01-18 MED ORDER — DOCUSATE SODIUM 100 MG PO CAPS
100.0000 mg | ORAL_CAPSULE | Freq: Two times a day (BID) | ORAL | Status: DC
  Administered 2018-01-18 – 2018-01-20 (×4): 100 mg via ORAL
  Filled 2018-01-18 (×4): qty 1

## 2018-01-18 MED ORDER — DULOXETINE HCL 30 MG PO CPEP
30.0000 mg | ORAL_CAPSULE | Freq: Every day | ORAL | Status: DC
  Administered 2018-01-18 – 2018-01-19 (×2): 30 mg via ORAL
  Filled 2018-01-18 (×2): qty 1

## 2018-01-18 MED ORDER — SODIUM CHLORIDE 0.9 % IR SOLN
Status: DC | PRN
  Administered 2018-01-18: 3000 mL

## 2018-01-18 MED ORDER — ONDANSETRON HCL 4 MG/2ML IJ SOLN
INTRAMUSCULAR | Status: DC | PRN
  Administered 2018-01-18: 4 mg via INTRAVENOUS

## 2018-01-18 MED ORDER — PROPOFOL 500 MG/50ML IV EMUL
INTRAVENOUS | Status: DC | PRN
  Administered 2018-01-18: 100 ug/kg/min via INTRAVENOUS

## 2018-01-18 MED ORDER — OXYCODONE HCL 5 MG/5ML PO SOLN: 5.0000 mg | Freq: Once | ORAL | Status: DC | PRN

## 2018-01-18 MED ORDER — OXYCODONE HCL 5 MG PO TABS
5.0000 mg | ORAL_TABLET | Freq: Once | ORAL | Status: DC | PRN
Start: 2018-01-18 — End: 2018-01-18

## 2018-01-18 MED ORDER — LACTATED RINGERS IV SOLN
INTRAVENOUS | Status: DC | PRN
  Administered 2018-01-18: 10:00:00 via INTRAVENOUS

## 2018-01-18 MED ORDER — MIDAZOLAM HCL 5 MG/5ML IJ SOLN
INTRAMUSCULAR | Status: DC | PRN
  Administered 2018-01-18 (×2): 1 mg via INTRAVENOUS

## 2018-01-18 MED ORDER — METOCLOPRAMIDE HCL 5 MG/ML IJ SOLN: 5.0000 mg | Freq: Three times a day (TID) | INTRAMUSCULAR | Status: DC | PRN

## 2018-01-18 MED ORDER — MENTHOL 3 MG MT LOZG: 1.0000 | LOZENGE | OROMUCOSAL | Status: DC | PRN

## 2018-01-18 MED ORDER — BUPIVACAINE-EPINEPHRINE (PF) 0.25% -1:200000 IJ SOLN
INTRAMUSCULAR | Status: DC | PRN
  Administered 2018-01-18: 30 mL

## 2018-01-18 MED ORDER — DEXAMETHASONE SODIUM PHOSPHATE 10 MG/ML IJ SOLN
10.0000 mg | Freq: Once | INTRAMUSCULAR | Status: AC
  Administered 2018-01-19: 10 mg via INTRAVENOUS
  Filled 2018-01-18: qty 1

## 2018-01-18 SURGICAL SUPPLY — 68 items
BANDAGE ACE 6X5 VEL STRL LF (GAUZE/BANDAGES/DRESSINGS) ×2 IMPLANT
BANDAGE ELASTIC 6 VELCRO ST LF (GAUZE/BANDAGES/DRESSINGS) ×1 IMPLANT
BANDAGE ESMARK 6X9 LF (GAUZE/BANDAGES/DRESSINGS) ×1 IMPLANT
BLADE SAGITTAL 13X1.27X60 (BLADE) ×2 IMPLANT
BLADE SAW SGTL 83.5X18.5 (BLADE) ×2 IMPLANT
BLADE SURG 10 STRL SS (BLADE) ×2 IMPLANT
BNDG CMPR 9X6 STRL LF SNTH (GAUZE/BANDAGES/DRESSINGS) ×1
BNDG ESMARK 6X9 LF (GAUZE/BANDAGES/DRESSINGS) ×2
BOWL SMART MIX CTS (DISPOSABLE) ×2 IMPLANT
BSPLAT TIB 5D D CMNT STM RT (Knees) ×1 IMPLANT
CEMENT BONE SIMPLEX SPEEDSET (Cement) ×4 IMPLANT
CLSR STERI-STRIP ANTIMIC 1/2X4 (GAUZE/BANDAGES/DRESSINGS) ×1 IMPLANT
COMP FEM PERSONA SZ7 RT (Joint) ×2 IMPLANT
COMP POLY PATELLA 35 DIA 9.0 (Joint) ×2 IMPLANT
COMPONENT FEM PERSONA SZ7 RT (Joint) IMPLANT
COMPONENT PLY PTLLA 35 DIA 9.0 (Joint) IMPLANT
COVER SURGICAL LIGHT HANDLE (MISCELLANEOUS) ×2 IMPLANT
CUFF TOURNIQUET SINGLE 34IN LL (TOURNIQUET CUFF) ×2 IMPLANT
DRAPE EXTREMITY T 121X128X90 (DRAPE) ×2 IMPLANT
DRAPE HALF SHEET 40X57 (DRAPES) ×2 IMPLANT
DRAPE INCISE IOBAN 66X45 STRL (DRAPES) ×4 IMPLANT
DRAPE U-SHAPE 47X51 STRL (DRAPES) ×2 IMPLANT
DRSG AQUACEL AG ADV 3.5X10 (GAUZE/BANDAGES/DRESSINGS) ×2 IMPLANT
DURAPREP 26ML APPLICATOR (WOUND CARE) ×4 IMPLANT
ELECT REM PT RETURN 9FT ADLT (ELECTROSURGICAL) ×2
ELECTRODE REM PT RTRN 9FT ADLT (ELECTROSURGICAL) ×1 IMPLANT
GLOVE BIOGEL M 7.0 STRL (GLOVE) IMPLANT
GLOVE BIOGEL PI IND STRL 7.5 (GLOVE) IMPLANT
GLOVE BIOGEL PI IND STRL 8.5 (GLOVE) ×1 IMPLANT
GLOVE BIOGEL PI INDICATOR 7.5 (GLOVE)
GLOVE BIOGEL PI INDICATOR 8.5 (GLOVE) ×1
GLOVE SURG ORTHO 8.0 STRL STRW (GLOVE) ×4 IMPLANT
GOWN STRL REUS W/ TWL LRG LVL3 (GOWN DISPOSABLE) ×1 IMPLANT
GOWN STRL REUS W/ TWL XL LVL3 (GOWN DISPOSABLE) ×2 IMPLANT
GOWN STRL REUS W/TWL 2XL LVL3 (GOWN DISPOSABLE) ×2 IMPLANT
GOWN STRL REUS W/TWL LRG LVL3 (GOWN DISPOSABLE) ×2
GOWN STRL REUS W/TWL XL LVL3 (GOWN DISPOSABLE) ×4
HANDPIECE INTERPULSE COAX TIP (DISPOSABLE) ×2
HOOD PEEL AWAY FACE SHEILD DIS (HOOD) ×6 IMPLANT
INSERT TIBIAL VE R CD6-9X10 (Insert) ×1 IMPLANT
KIT BASIN OR (CUSTOM PROCEDURE TRAY) ×2 IMPLANT
KIT TURNOVER KIT B (KITS) ×2 IMPLANT
MANIFOLD NEPTUNE II (INSTRUMENTS) ×2 IMPLANT
NDL 18GX1X1/2 (RX/OR ONLY) (NEEDLE) IMPLANT
NEEDLE 18GX1X1/2 (RX/OR ONLY) (NEEDLE) IMPLANT
NEEDLE 22X1 1/2 (OR ONLY) (NEEDLE) ×4 IMPLANT
NS IRRIG 1000ML POUR BTL (IV SOLUTION) ×2 IMPLANT
PACK TOTAL JOINT (CUSTOM PROCEDURE TRAY) ×2 IMPLANT
PAD ARMBOARD 7.5X6 YLW CONV (MISCELLANEOUS) ×4 IMPLANT
SET HNDPC FAN SPRY TIP SCT (DISPOSABLE) ×1 IMPLANT
STEM TIBIA 5 DEG SZ D R KNEE (Knees) IMPLANT
STRIP CLOSURE SKIN 1/2X4 (GAUZE/BANDAGES/DRESSINGS) ×2 IMPLANT
SUCTION FRAZIER HANDLE 10FR (MISCELLANEOUS)
SUCTION TUBE FRAZIER 10FR DISP (MISCELLANEOUS) IMPLANT
SUT BONE WAX W31G (SUTURE) ×2 IMPLANT
SUT MNCRL AB 3-0 PS2 18 (SUTURE) ×2 IMPLANT
SUT VIC AB 0 CTB1 27 (SUTURE) ×2 IMPLANT
SUT VIC AB 1 CT1 27 (SUTURE) ×4
SUT VIC AB 1 CT1 27XBRD ANBCTR (SUTURE) ×2 IMPLANT
SUT VIC AB 2-0 CT1 27 (SUTURE) ×4
SUT VIC AB 2-0 CT1 TAPERPNT 27 (SUTURE) ×2 IMPLANT
SUT VLOC 180 0 24IN GS25 (SUTURE) ×2 IMPLANT
SYR 20CC LL (SYRINGE) ×4 IMPLANT
TIBIA STEM 5 DEG SZ D R KNEE (Knees) ×2 IMPLANT
TOWEL OR 17X24 6PK STRL BLUE (TOWEL DISPOSABLE) ×2 IMPLANT
TOWEL OR 17X26 10 PK STRL BLUE (TOWEL DISPOSABLE) ×2 IMPLANT
TRAY CATH 16FR W/PLASTIC CATH (SET/KITS/TRAYS/PACK) IMPLANT
WRAP KNEE MAXI GEL POST OP (GAUZE/BANDAGES/DRESSINGS) ×2 IMPLANT

## 2018-01-18 NOTE — Anesthesia Procedure Notes (Signed)
Date/Time: 01/18/2018 9:42 AM Performed by: Larina BrasSmith, Emilee, RN Oxygen Delivery Method: Nasal cannula Placement Confirmation: positive ETCO2 Dental Injury: Teeth and Oropharynx as per pre-operative assessment

## 2018-01-18 NOTE — Op Note (Signed)
TOTAL KNEE REPLACEMENT OPERATIVE NOTE:  01/18/2018  3:14 PM  PATIENT:  Hector Gonzalez  52 y.o. male  PRE-OPERATIVE DIAGNOSIS:  Osteoarthritis Right Knee  POST-OPERATIVE DIAGNOSIS:  Osteoarthritis Right Knee  PROCEDURE:  Procedure(s): RIGHT TOTAL KNEE ARTHROPLASTY  SURGEON:  Surgeon(s): Dannielle HuhLucey, Brynja Marker, MD  PHYSICIAN ASSISTANT: Laurier Nancyolby Robbins, [facility]  ANESTHESIA:   spinal  DRAINS: Hemovac  SPECIMEN: None  COUNTS:  Correct  TOURNIQUET:  * Missing tourniquet times found for documented tourniquets in log: [id number] *  DICTATION:  Indication for procedure:    The patient is a [age] y.o. male who has failed conservative treatment for Osteoarthritis Right Knee.  Informed consent was obtained prior to anesthesia. The risks versus benefits of the operation were explain and in a way the patient can, and did, understand.   On the implant demand matching protocol, this patient scored 14.  Therefore, this patient was receive a polyethylene insert with vitamin E which is a high demand implant.  Description of procedure:     The patient was taken to the operating room and placed under anesthesia.  The patient was positioned in the usual fashion taking care that all body parts were adequately padded and/or protected.  I foley catheter was not placed.  A tourniquet was applied and the leg prepped and draped in the usual sterile fashion.  The extremity was exsanguinated with the esmarch and tourniquet inflated to 350 mmHg.  Pre-operative range of motion was normal.  The knee was in 8 degree of mild valgus.  A midline incision approximately 6-7 inches long was made with a #10 blade.  A new blade was used to make a parapatellar arthrotomy going 2-3 cm into the quadriceps tendon, over the patella, and alongside the medial aspect of the patellar tendon.  A synovectomy was then performed with the #10 blade and forceps. I then elevated the deep MCL off the medial tibial metaphysis subperiosteally around to the  semimembranosus attachment.    I everted the patella and used calipers to measure patellar thickness.  I used the reamer to ream down to appropriate thickness to recreate the native thickness.  I then removed excess bone with the rongeur and sagittal saw.  I used the appropriately sized template and drilled the three lug holes.  I then put the trial in place and measured the thickness with the calipers to ensure recreation of the native thickness.  The trial was then removed and the patella subluxed and the knee brought into flexion.  A homan retractor was place to retract and protect the patella and lateral structures.  A Z-retractor was place medially to protect the medial structures.  The extra-medullary alignment system was used to make cut the tibial articular surface perpendicular to the anamotic axis of the tibia and in 3 degrees of posterior slope.  The cut surface and alignment jig was removed.  I then used the intramedullary alignment guide to make a 4 valgus cut on the distal femur.  I then marked out the epicondylar axis on the distal femur.  The posterior condylar axis measured 3 degrees.  I then used the anterior referencing sizer and measured the femur to be a size 7.  The 4-In-1 cutting block was screwed into place in external rotation matching the posterior condylar angle, making our cuts perpendicular to the epicondylar axis.  Anterior, posterior and chamfer cuts were made with the sagittal saw.  The cutting block and cut pieces were removed.  A lamina spreader was placed in  90 degrees of flexion.  The ACL, PCL, menisci, and posterior condylar osteophytes were removed.  A 10 mm spacer blocked was found to offer good flexion and extension gap balance after minimal in degree releasing.   The scoop retractor was then placed and the femoral finishing block was pinned in place.  The small sagittal saw was used as well as the lug drill to finish the femur.  The block and cut surfaces were removed  and the medullary canal hole filled with autograft bone from the cut pieces.  The tibia was delivered forward in deep flexion and external rotation.  A size D tray was selected and pinned into place centered on the medial 1/3 of the tibial tubercle.  The reamer and keel was used to prepare the tibia through the tray.    I then trialed with the size 7 femur, size D tibia, a 10 mm insert and the 35 patella.  I had excellent flexion/extension gap balance, excellent patella tracking.  Flexion was full and beyond 120 degrees; extension was zero.  These components were chosen and the staff opened them to me on the back table while the knee was lavaged copiously and the cement mixed.  The soft tissue was infiltrated with 60cc of exparel 1.3% through a 21 gauge needle.  I cemented in the components and removed all excess cement.  The polyethylene tibial component was snapped into place and the knee placed in extension while cement was hardening.  The capsule was infilltrated with 30cc of .25% Marcaine with epinephrine.  A hemovac was place in the joint exiting superolaterally.  A pain pump was place superomedially superficial to the arthrotomy.  Once the cement was hard, the tourniquet was let down.  Hemostasis was obtained.  The arthrotomy was closed with figure-8 #1 vicryl sutures.  The deep soft tissues were closed with #0 vicryls and the subcuticular layer closed with a running #2-0 vicryl.  The skin was reapproximated and closed with skin staples.  The wound was dressed with xeroform, 4 x4's, 2 ABD sponges, a single layer of webril and a TED stocking.   The patient was then awakened, extubated, and taken to the recovery room in stable condition.  BLOOD LOSS:  300cc DRAINS: 1 hemovac, 1 pain catheter COMPLICATIONS:  None.  PLAN OF CARE: Admit for overnight observation  PATIENT DISPOSITION:  PACU - hemodynamically stable.   Delay start of Pharmacological VTE agent (>24hrs) due to surgical blood loss or  risk of bleeding:  not applicable  Please fax a copy of this op note to my office at [phone] (please only include page 1 and 2 of the Case Information op note)

## 2018-01-18 NOTE — Evaluation (Signed)
Physical Therapy Evaluation Patient Details Name: Hector Gonzalez MRN: [id number] DOB: [birth year] Today's Date: 01/18/2018   History of Present Illness  Hector Gonzalez is a 52 y.o. s/p R TKR. PMH includes HLD, Depression, Opiod dependence, chronic myofascial pain, and paresthesia.   Clinical Impression  Pt presents s/p above and deficits below. Pt was educated on precautions. Pt and wife were educated on supine HEP. Pt performed sit<>stand transfers and ambulated with a RW and MinG-MinA. Will continue to follow acutely to support independence, mobility, and safety.     Follow Up Recommendations Follow surgeon's recommendation for DC plan and follow-up therapies;Supervision - Intermittent    Equipment Recommendations  3in1 (PT)    Recommendations for Other Services       Precautions / Restrictions Precautions Precautions: Knee Precaution Booklet Issued: Yes (comment) Precaution Comments: Pt educated on precautions. Pt and wife educated on supine HEP Restrictions Weight Bearing Restrictions: Yes RLE Weight Bearing: Weight bearing as tolerated      Mobility  Bed Mobility Overal bed mobility: Needs Assistance Bed Mobility: Supine to Sit     Supine to sit: Min guard     General bed mobility comments: Pt was able to sit up at EOB and scoot hips to EOB. Pt needed MinG and cues for sitting up, but had no apparent exertion sitting up. Pt reported no dizziness with sitting up.    Transfers Overall transfer level: Needs assistance Equipment used: Rolling walker (2 wheeled) Transfers: Sit to/from Stand Sit to Stand: Min assist         General transfer comment: Pt required MinA for sit<>stand transfers. Pt required cues for safe use of RW, and sequencing. Pt had some difficulty with initial power up, requiring MinA. Pt reported some dizziness with standing, which subsided. Pt required MinA for balance once standing. Pt had episode of incontinence upon standing, so ambulated to  bathroom with RW.   Ambulation/Gait Ambulation/Gait assistance: Min assist;Min guard Gait Distance (Feet): 20 [id number]) Assistive device: Rolling walker (2 wheeled) Gait Pattern/deviations: Wide base of support;Trunk flexed;Decreased stance time - right;Decreased weight shift to right;Antalgic;Step-to pattern;Decreased step length - left Gait velocity: Decreased   General Gait Details: Pt had a wide base of support and a flexed trunk with a step-to pattern using the RW, but responded to cues for posture and had no visible LOB. Pt's gait speed was decreased and pt was slow moving, likely due to guarding. Pt achieved good knee extension of R LE during gait. MinG-MinA was for steadying throughout gait.    Stairs            Wheelchair Mobility    Modified Rankin (Stroke Patients Only)       Balance Overall balance assessment: Needs assistance Sitting-balance support: No upper extremity supported;Feet supported Sitting balance-Leahy Scale: Fair Sitting balance - Comments: Pt was able to raise arms over head as PT put on gait belt   Standing balance support: No upper extremity supported Standing balance-Leahy Scale: Fair Standing balance comment: Pt was able to wash hands on the sink with no UE support                             Pertinent Vitals/Pain Pain Assessment: Faces Faces Pain Scale: Hurts a little bit Pain Location: R Knee Pain Descriptors / Indicators: Guarding;Grimacing Pain Intervention(s): Limited activity within patient's tolerance;Monitored during session;Repositioned    Home Living Family/patient expects to be discharged to:: Private residence Living Arrangements:  Spouse/significant other Available Help at Discharge: Family;Available 24 hours/day Type of Home: House Home Access: Stairs to enter Entrance Stairs-Rails: Right Entrance Stairs-Number of Steps: 3-6 Home Layout: One level Home Equipment: Walker - 2 wheels      Prior Function  Level of Independence: Independent               Hand Dominance   Dominant Hand: Right    Extremity/Trunk Assessment   Upper Extremity Assessment Upper Extremity Assessment: Overall WFL for tasks assessed    Lower Extremity Assessment Lower Extremity Assessment: RLE deficits/detail RLE Deficits / Details: Deficits and pain as expected s/p R TKR RLE Sensation: (Abnormal sensation around the groin)    Cervical / Trunk Assessment Cervical / Trunk Assessment: Normal  Communication   Communication: No difficulties  Cognition Arousal/Alertness: Awake/alert Behavior During Therapy: WFL for tasks assessed/performed Overall Cognitive Status: Within Functional Limits for tasks assessed                                 General Comments: Pt seemed alert and pleasant      General Comments      Exercises Total Joint Exercises Ankle Circles/Pumps: AROM;20 reps;Supine;Both Quad Sets: AROM;Right;10 reps;Supine Heel Slides: AROM;Right;10 reps;Supine   Assessment/Plan    PT Assessment Patient needs continued PT services  PT Problem List Decreased strength;Decreased range of motion;Decreased balance;Decreased mobility;Decreased knowledge of use of DME;Decreased knowledge of precautions;Pain       PT Treatment Interventions DME instruction;Gait training;Stair training;Functional mobility training;Therapeutic activities;Therapeutic exercise;Patient/family education    PT Goals (Current goals can be found in the Care Plan section)  Acute Rehab PT Goals 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 : Unable Difficulty sitting down on and standing up from a chair with arms (e.g., wheelchair, bedside commode, etc,.)?: Unable Help needed moving to and from a bed to chair (including a wheelchair)?: A Little Help needed walking in hospital room?: A Little Help needed climbing 3-5 steps with a railing? : A Lot 6 Click Score: 11    End of Session Equipment Utilized During Treatment: Gait belt Activity Tolerance: Patient tolerated treatment well Patient left: in chair;with call bell/phone within reach;with family/visitor present Nurse Communication: Mobility status PT Visit Diagnosis: Other abnormalities of gait and mobility (R26.89);Unsteadiness on feet (R26.81);Muscle weakness (generalized) (M62.81);Difficulty in walking, not elsewhere classified (R26.2);Pain Pain - Right/Left: Right Pain - part of body: Knee    Time: [id number] PT Time Calculation (min) (ACUTE ONLY): 39 min   Charges:   PT Evaluation $PT Eval Low Complexity: 1 Low PT Treatments $Therapeutic Activity: 23-37 mins    Demetria PoreJulia Abdirahman Chittum, S-DPT Acute Care Rehab Student [phone]     01/18/2018, 6:04 PM

## 2018-01-18 NOTE — H&P (Signed)
Hector Gonzalez MRN:  [id number] DOB/SEX:  [birth year]/male  CHIEF COMPLAINT:  Painful right Knee  HISTORY: Patient is a 52 y.o. male presented with a history of pain in the right knee. Onset of symptoms was gradual starting a few years ago with gradually worsening course since that time. Patient has been treated conservatively with over-the-counter NSAIDs and activity modification. Patient currently rates pain in the knee at 10 out of 10 with activity. There is pain at night.  PAST MEDICAL HISTORY: Patient Active Problem List   Diagnosis Date Noted  . Hyperlipidemia 12/25/2016  . Sensation of fullness in left ear 05/16/2015  . Allergic rhinitis 05/16/2015  . Sweating abnormality 03/19/2015  . Pain in left elbow 02/15/2015  . Chronic myofascial pain 12/19/2014  . Neuropathic pain 12/19/2014  . Clinical depression 12/19/2014  . Continuous opioid dependence (HCC) 12/19/2014  . Dyslipidemia 12/19/2014  . Paresthesia 09/05/2013   Past Medical History:  Diagnosis Date  . Hyperlipidemia   . Nerve pain    Past Surgical History:  Procedure Laterality Date  . ANKLE SURGERY    . HERNIA REPAIR  2000   bilinguial hernia  . KNEE SURGERY Right      MEDICATIONS:   Medications Prior to Admission  Medication Sig Dispense Refill Last Dose  . b complex vitamins capsule Take 1 capsule by mouth 2 (two) times daily.    01/17/2018 at Unknown time  . Bioflavonoid Products (VITAMIN C) CHEW Chew 1 tablet by mouth daily with lunch.   01/11/2018  . busPIRone (BUSPAR) 5 MG tablet Take 5 mg by mouth 2 (two) times daily.   01/18/2018 at Unknown time  . diphenhydramine-acetaminophen (TYLENOL PM) 25-500 MG TABS tablet Take 1 tablet by mouth at bedtime.   01/17/2018 at Unknown time  . DULoxetine (CYMBALTA) 30 MG capsule Take 30 mg by mouth at bedtime.   01/17/2018 at Unknown time  . EQUALACTIN 625 MG CHEW Chew 1 tablet by mouth at bedtime.   01/17/2018 at Unknown time  . gabapentin (NEURONTIN) 300 MG capsule Take  600 mg by mouth 3 (three) times daily.   01/18/2018 at Unknown time  . Glucosamine-Chondroitin (COSAMIN DS PO) Take 1 tablet by mouth daily.   01/11/2018  . Multiple Vitamins-Minerals (ZINC PO) Take 1 tablet by mouth daily with lunch.   01/11/2018  . OXYCONTIN 10 MG 12 hr tablet Take 10 mg by mouth 2 (two) times daily.  0 01/18/2018 at Unknown time  . oxymorphone (OPANA ER) 5 MG 12 hr tablet Take 1 tablet (5 mg total) by mouth every morning. 30 tablet 0 More than a month at Unknown time  . Oxymorphone ER, Crush Resist, (OPANA ER, CRUSH RESISTANT,) 15 MG PO T12A Take 1 tablet by mouth every morning. 30 tablet 0     ALLERGIES:  No Known Allergies  REVIEW OF SYSTEMS:  A comprehensive review of systems was negative except for: Musculoskeletal: positive for arthralgias and bone pain   FAMILY HISTORY:   Family History  Problem Relation Age of Onset  . Cancer Mother   . Alzheimer's disease Father   . Cancer Unknown   . Diabetes Unknown   . Mental illness Unknown     SOCIAL HISTORY:   Social History   Tobacco Use  . Smoking status: Never Smoker  . Smokeless tobacco: Never Used  Substance Use Topics  . Alcohol use: No     EXAMINATION:  Vital signs in last 24 hours: Temp:  [97.5 F (36.4 C)] 97.5  F (36.4 C) (07/29 0753) Pulse Rate:  [59] 59 (07/29 0753) Resp:  [20] 20 (07/29 0753) BP: (145)/(95) 145/95 (07/29 0753) SpO2:  [98 %] 98 % (07/29 0753) Weight:  [76.7 kg (169 lb)] 76.7 kg (169 lb) (07/29 0753)  BP (!) 145/95   Pulse (!) 59   Temp (!) 97.5 F (36.4 C) (Oral)   Resp 20   Wt 76.7 kg (169 lb)   SpO2 98%   BMI 27.28 kg/m   General Appearance:    Alert, cooperative, no distress, appears stated age  Head:    Normocephalic, without obvious abnormality, atraumatic  Eyes:    PERRL, conjunctiva/corneas clear, EOM's intact, fundi    benign, both eyes       Ears:    Normal TM's and external ear canals, both ears  Nose:   Nares normal, septum midline, mucosa normal, no  drainage    or sinus tenderness  Throat:   Lips, mucosa, and tongue normal; teeth and gums normal  Neck:   Supple, symmetrical, trachea midline, no adenopathy;       thyroid:  No enlargement/tenderness/nodules; no carotid   bruit or JVD  Back:     Symmetric, no curvature, ROM normal, no CVA tenderness  Lungs:     Clear to auscultation bilaterally, respirations unlabored  Chest wall:    No tenderness or deformity  Heart:    Regular rate and rhythm, S1 and S2 normal, no murmur, rub   or gallop  Abdomen:     Soft, non-tender, bowel sounds active all four quadrants,    no masses, no organomegaly  Genitalia:    Normal male without lesion, discharge or tenderness  Rectal:    Normal tone, normal prostate, no masses or tenderness;   guaiac negative stool  Extremities:   Extremities normal, atraumatic, no cyanosis or edema  Pulses:   2+ and symmetric all extremities  Skin:   Skin color, texture, turgor normal, no rashes or lesions  Lymph nodes:   Cervical, supraclavicular, and axillary nodes normal  Neurologic:   CNII-XII intact. Normal strength, sensation and reflexes      throughout    Musculoskeletal:  ROM 0-120, Ligaments intact,  Imaging Review Plain radiographs demonstrate severe degenerative joint disease of the right knee. The overall alignment is neutral. The bone quality appears to be good for age and reported activity level.  Assessment/Plan: Primary osteoarthritis, right knee   The patient history, physical examination and imaging studies are consistent with advanced degenerative joint disease of the right  knee. The patient has failed conservative treatment.  The clearance notes were reviewed.  After discussion with the patient it was felt that Total Knee Replacement was indicated. The procedure,  risks, and benefits of total knee arthroplasty were presented and reviewed. The risks including but not limited to aseptic loosening, infection, blood clots, vascular injury, stiffness,  patella tracking problems complications among others were discussed. The patient acknowledged the explanation, agreed to proceed with the plan.  Preoperative templating of the joint replacement has been completed, documented, and submitted to the Operating Room personnel in order to optimize intra-operative equipment management.   Patient's anticipated LOS is less than 2 midnights, meeting these requirements: - Younger than 50 - Lives within 1 hour of care - Has a competent adult at home to recover with post-op recover - NO history of  - Chronic pain requiring opiods  - Diabetes  - Coronary Artery Disease  - Heart failure  - Heart attack  -  Stroke  - DVT/VTE  - Cardiac arrhythmia  - Respiratory Failure/COPD  - Renal failure  - Anemia  - Advanced Liver disease      Guy SandiferColby Alan Robbins 01/18/2018, 9:04 AM

## 2018-01-18 NOTE — Progress Notes (Signed)
Orthopedic Tech Progress Note Patient Details:  Hector Gonzalez 05/22/1966 [id number]  CPM Right Knee CPM Right Knee: On Right Knee Flexion (Degrees): 90 Right Knee Extension (Degrees): 0 Additional Comments: trapeze bar patient helper Viewed order from doctor's order list Post Interventions Patient Tolerated: Well Instructions Provided: Care of device  Nikki DomCrawford, Bijou Easler 01/18/2018, 12:17 PM

## 2018-01-18 NOTE — Anesthesia Preprocedure Evaluation (Signed)
Anesthesia Evaluation  Patient identified by MRN, date of birth, ID band Patient awake    Reviewed: Allergy & Precautions, NPO status , Patient's Chart, lab work & pertinent test results  History of Anesthesia Complications Negative for: history of anesthetic complications  Airway Mallampati: I  TM Distance: >3 FB Neck ROM: Full    Dental  (+) Teeth Intact   Pulmonary neg pulmonary ROS,    breath sounds clear to auscultation       Cardiovascular negative cardio ROS   Rhythm:Regular     Neuro/Psych PSYCHIATRIC DISORDERS Depression negative neurological ROS     GI/Hepatic negative GI ROS, Neg liver ROS,   Endo/Other  negative endocrine ROS  Renal/GU negative Renal ROS     Musculoskeletal  (+) Arthritis ,   Abdominal   Peds  Hematology negative hematology ROS (+)   Anesthesia Other Findings Chronic pain med  Reproductive/Obstetrics                             Anesthesia Physical Anesthesia Plan  ASA: II  Anesthesia Plan: MAC, Spinal and Regional   Post-op Pain Management:    Induction:   PONV Risk Score and Plan: 1 and Ondansetron and Treatment may vary due to age or medical condition  Airway Management Planned: Nasal Cannula  Additional Equipment: None  Intra-op Plan:   Post-operative Plan:   Informed Consent: I have reviewed the patients History and Physical, chart, labs and discussed the procedure including the risks, benefits and alternatives for the proposed anesthesia with the patient or authorized representative who has indicated his/her understanding and acceptance.   Dental advisory given  Plan Discussed with: CRNA and Surgeon  Anesthesia Plan Comments:         Anesthesia Quick Evaluation

## 2018-01-18 NOTE — OR Nursing (Signed)
1125: in&out cath=200cc cyu, per protocol, no trauma.

## 2018-01-18 NOTE — Anesthesia Procedure Notes (Signed)
Spinal  Patient location during procedure: OR Start time: 01/18/2018 9:43 AM End time: 01/18/2018 9:46 AM Staffing Anesthesiologist: Val EagleMoser, Jiovanny Burdell, MD Preanesthetic Checklist Completed: patient identified, surgical consent, pre-op evaluation, timeout performed, IV checked, risks and benefits discussed and monitors and equipment checked Spinal Block Patient position: sitting Prep: site prepped and draped and DuraPrep Patient monitoring: heart rate, cardiac monitor, continuous pulse ox and blood pressure Approach: midline Location: L4-5 Injection technique: single-shot Needle Needle type: Pencan  Needle gauge: 24 G Needle length: 10 cm Assessment Sensory level: T6

## 2018-01-18 NOTE — Anesthesia Procedure Notes (Signed)
Anesthesia Regional Block: Adductor canal block   Pre-Anesthetic Checklist: ,, timeout performed, Correct Patient, Correct Site, Correct Laterality, Correct Procedure, Correct Position, site marked, Risks and benefits discussed,  Surgical consent,  Pre-op evaluation,  At surgeon's request and post-op pain management  Laterality: Lower and Right  Prep: chloraprep       Needles:  Injection technique: Single-shot  Needle Type: Echogenic Stimulator Needle          Additional Needles:   Procedures:,,,, ultrasound used (permanent image in chart),,,,  Narrative:  Start time: 01/18/2018 9:21 AM End time: 01/18/2018 9:23 AM Injection made incrementally with aspirations every 5 mL.  Performed by: Personally  Anesthesiologist: Val EagleMoser, Nicholus Chandran, MD  Additional Notes: H+P and labs reviewed, risks and benefits discussed with patient, procedure tolerated well without complications

## 2018-01-18 NOTE — Transfer of Care (Signed)
Immediate Anesthesia Transfer of Care Note  Patient: Hector Gonzalez  Procedure(s) Performed: RIGHT TOTAL KNEE ARTHROPLASTY (Right Knee)  Patient Location: PACU  Anesthesia Type:Spinal and MAC combined with regional for post-op pain  Level of Consciousness: awake, alert  and oriented  Airway & Oxygen Therapy: Patient Spontanous Breathing and Patient connected to nasal cannula oxygen  Post-op Assessment: Report given to RN and Post -op Vital signs reviewed and stable  Post vital signs: Reviewed and stable  Last Vitals:  Vitals Value Taken Time  BP 115/56 01/18/2018 11:33 AM  Temp    Pulse 58 01/18/2018 11:34 AM  Resp 9 01/18/2018 11:34 AM  SpO2 95 % 01/18/2018 11:34 AM  Vitals shown include unvalidated device data.  Last Pain:  Vitals:   01/18/18 0823  TempSrc:   PainSc: 0-No pain         Complications: No apparent anesthesia complications

## 2018-01-19 ENCOUNTER — Encounter (HOSPITAL_COMMUNITY): Payer: Self-pay | Admitting: Orthopedic Surgery

## 2018-01-19 DIAGNOSIS — M1711 Unilateral primary osteoarthritis, right knee: Secondary | ICD-10-CM | POA: Diagnosis not present

## 2018-01-19 DIAGNOSIS — E785 Hyperlipidemia, unspecified: Secondary | ICD-10-CM | POA: Diagnosis not present

## 2018-01-19 DIAGNOSIS — Z79899 Other long term (current) drug therapy: Secondary | ICD-10-CM | POA: Diagnosis not present

## 2018-01-19 DIAGNOSIS — Z7982 Long term (current) use of aspirin: Secondary | ICD-10-CM | POA: Diagnosis not present

## 2018-01-19 DIAGNOSIS — F329 Major depressive disorder, single episode, unspecified: Secondary | ICD-10-CM | POA: Diagnosis not present

## 2018-01-19 DIAGNOSIS — R262 Difficulty in walking, not elsewhere classified: Secondary | ICD-10-CM | POA: Diagnosis not present

## 2018-01-19 LAB — CBC
HEMATOCRIT: 38.4 % — AB (ref 39.0–52.0)
Hemoglobin: 13.5 g/dL (ref 13.0–17.0)
MCH: 31 pg (ref 26.0–34.0)
MCHC: 35.2 g/dL (ref 30.0–36.0)
MCV: 88.3 fL (ref 78.0–100.0)
PLATELETS: 217 10*3/uL (ref 150–400)
RBC: 4.35 MIL/uL (ref 4.22–5.81)
RDW: 12.1 % (ref 11.5–15.5)
WBC: 13 10*3/uL — AB (ref 4.0–10.5)

## 2018-01-19 LAB — BASIC METABOLIC PANEL
Anion gap: 8 (ref 5–15)
BUN: 17 mg/dL (ref 6–20)
CO2: 28 mmol/L (ref 22–32)
CREATININE: 1.19 mg/dL (ref 0.61–1.24)
Calcium: 8.6 mg/dL — ABNORMAL LOW (ref 8.9–10.3)
Chloride: 102 mmol/L (ref 98–111)
GFR calc Af Amer: >60 mL/min (ref >60)
Glucose, Bld: 114 mg/dL — ABNORMAL HIGH (ref 70–99)
Potassium: 4 mmol/L (ref 3.5–5.1)
Sodium: 138 mmol/L (ref 135–145)

## 2018-01-19 MED ORDER — ACETAMINOPHEN 500 MG PO TABS: 1000.0000 mg | ORAL_TABLET | Freq: Four times a day (QID) | ORAL | 0 refills | Status: DC

## 2018-01-19 MED ORDER — METHOCARBAMOL 500 MG PO TABS: 500.0000 mg | ORAL_TABLET | Freq: Four times a day (QID) | ORAL | 0 refills | Status: DC | PRN

## 2018-01-19 MED ORDER — OXYCODONE HCL 10 MG PO TABS: 10.0000 mg | ORAL_TABLET | Freq: Four times a day (QID) | ORAL | 0 refills | Status: DC | PRN

## 2018-01-19 MED ORDER — HYDROMORPHONE HCL 2 MG PO TABS: 2.0000 mg | ORAL_TABLET | Freq: Four times a day (QID) | ORAL | 0 refills | Status: DC | PRN

## 2018-01-19 MED ORDER — ASPIRIN 325 MG PO TBEC: 325.0000 mg | DELAYED_RELEASE_TABLET | Freq: Two times a day (BID) | ORAL | 0 refills | Status: DC

## 2018-01-19 NOTE — Plan of Care (Signed)
Problem: Education: Goal: Knowledge of General Education information will improve Description Including pain rating scale, medication(s)/side effects and non-pharmacologic comfort measures Outcome: Progressing   Problem: Health Behavior/Discharge Planning: Goal: Ability to manage health-related needs will improve Outcome: Progressing   Problem: Clinical Measurements: Goal: Ability to maintain clinical measurements within normal limits will improve Outcome: Progressing   Problem: Nutrition: Goal: Adequate nutrition will be maintained Outcome: Progressing   Problem: Elimination: Goal: Will not experience complications related to bowel motility Outcome: Progressing Goal: Will not experience complications related to urinary retention Outcome: Progressing   Problem: Pain Managment: Goal: General experience of comfort will improve Outcome: Progressing   Problem: Safety: Goal: Ability to remain free from injury will improve Outcome: Progressing   Problem: Education: Goal: Knowledge of the prescribed therapeutic regimen will improve Outcome: Progressing   Problem: Activity: Goal: Range of joint motion will improve Outcome: Progressing   Problem: Pain Management: Goal: Pain level will decrease with appropriate interventions Outcome: Progressing

## 2018-01-19 NOTE — Progress Notes (Signed)
Physical Therapy Treatment Patient Details Name: Hector Gonzalez MRN: [id number] DOB: 12/10/1965 Today's Date: 01/19/2018    History of Present Illness Hector Gonzalez is a 52 y.o. s/p R TKR. PMH includes HLD, Depression, Opiod dependence, chronic myofascial pain, and paresthesia.     PT Comments    Pt performed gait with cues for gt symmetry with step through pattern.  Despite c/o pain he is progressing quite well.  Further progression through HEP this pm with good toleration.  Pt refused stair training secondary to pain.  Pt reports he pain remains uncontrolled and he is planning for d/c tomorrow.  Will plan for stair training in am.      Follow Up Recommendations  Follow surgeon's recommendation for DC plan and follow-up therapies;Supervision - Intermittent     Equipment Recommendations  3in1 (PT)    Recommendations for Other Services       Precautions / Restrictions Precautions Precautions: Knee Precaution Booklet Issued: Yes (comment) Precaution Comments: Pt educated on precautions.  Restrictions Weight Bearing Restrictions: Yes RLE Weight Bearing: Weight bearing as tolerated    Mobility  Bed Mobility Overal bed mobility: Modified Independent Bed Mobility: Sit to Supine      Sit to supine: Modified independent (Device/Increase time)   General bed mobility comments: No assistance needed patient slow and guarded due to pain.    Transfers Overall transfer level: Needs assistance Equipment used: Rolling walker (2 wheeled) Transfers: Sit to/from Stand Sit to Stand: Supervision         General transfer comment: Cues for hand placement to and from seated surface.    Ambulation/Gait Ambulation/Gait assistance: Supervision Gait Distance (Feet): 200 Feet Assistive device: Rolling walker (2 wheeled) Gait Pattern/deviations: Step-through pattern;Decreased stance time - right;Decreased weight shift to right;Decreased dorsiflexion - right;Antalgic Gait velocity:  Decreased   General Gait Details: Cues for R heel strike and knee extension in stance phase.  Knee extension improving as gt progressed.  Pt steady throughout but grimmacing noted due to pain.  Cues for step through pattern and gait symmetry.     Stairs             Wheelchair Mobility    Modified Rankin (Stroke Patients Only)       Balance Overall balance assessment: Needs assistance Sitting-balance support: No upper extremity supported;Feet supported Sitting balance-Leahy Scale: Fair Sitting balance - Comments: Pt was able to raise arms over head as PT put on gait belt   Standing balance support: No upper extremity supported Standing balance-Leahy Scale: Fair Standing balance comment: Pt was able to wash hands on the sink with no UE support                            Cognition Arousal/Alertness: Awake/alert Behavior During Therapy: WFL for tasks assessed/performed Overall Cognitive Status: Within Functional Limits for tasks assessed                                 General Comments: Pt seemed alert and pleasant      Exercises Total Joint Exercises Ankle Circles/Pumps: AROM;20 reps;Supine;Both Quad Sets: AROM;Right;10 reps;Supine Heel Slides: AROM;Right;10 reps;Supine Hip ABduction/ADduction: AROM;Right;10 reps;Supine Straight Leg Raises: AROM;Right;10 reps;Supine Goniometric ROM: 7-76 degrees flexion in R knee.      General Comments        Pertinent Vitals/Pain Pain Assessment: 0-10 Pain Score: 8  Pain Location: R Knee Pain  Descriptors / Indicators: Guarding;Grimacing Pain Intervention(s): Monitored during session;RN gave pain meds during session;Repositioned;Ice applied    Home Living                      Prior Function            PT Goals (current goals can now be found in the care plan section) Acute Rehab PT Goals Patient Stated Goal: To reduce pain PT Goal Formulation: With patient Potential to Achieve Goals:  Good Progress towards PT goals: Progressing toward goals    Frequency    7X/week      PT Plan Current plan remains appropriate    Co-evaluation              AM-PAC PT "6 Clicks" Daily Activity  Outcome Measure  Difficulty turning over in bed (including adjusting bedclothes, sheets and blankets)?: None Difficulty moving from lying on back to sitting on the side of the bed? : None Difficulty sitting down on and standing up from a chair with arms (e.g., wheelchair, bedside commode, etc,.)?: A Little Help needed moving to and from a bed to chair (including a wheelchair)?: A Little Help needed walking in hospital room?: A Little Help needed climbing 3-5 steps with a railing? : A Little 6 Click Score: 20    End of Session Equipment Utilized During Treatment: Gait belt Activity Tolerance: Patient tolerated treatment well Patient left: in chair;with call bell/phone within reach;with family/visitor present Nurse Communication: Mobility status PT Visit Diagnosis: Other abnormalities of gait and mobility (R26.89);Unsteadiness on feet (R26.81);Muscle weakness (generalized) (M62.81);Difficulty in walking, not elsewhere classified (R26.2);Pain Pain - Right/Left: Right Pain - part of body: Knee     Time: 1421-1449 PT Time Calculation (min) (ACUTE ONLY): 28 min  Charges:  $Gait Training: 8-22 mins $Therapeutic Exercise: 8-22 mins                      Joycelyn Rua, PTA pager [phone]    Florestine Avers 01/19/2018, 2:55 PM

## 2018-01-19 NOTE — Progress Notes (Signed)
SPORTS MEDICINE AND JOINT REPLACEMENT  Hector SpurlingStephen Lucey, MD    Hector Nancyolby [name], PA-C [address], [place], [place]  [id number]                             [phone]   PROGRESS NOTE  Subjective:  negative for Chest Pain  negative for Shortness of Breath  negative for Nausea/Vomiting   negative for Calf Pain  negative for Bowel Movement   Tolerating Diet: yes         Patient reports pain as 6 on 0-10 scale.    Objective: Vital signs in last 24 hours:    Patient Vitals for the past 24 hrs:  BP Temp Temp src Pulse Resp SpO2 Height Weight  01/19/18 0401 124/72 98 F (36.7 C) Oral 89 18 96 % 5\' 6"  (1.676 m) 80.1 kg (176 lb 8 oz)  01/18/18 1925 (!) 167/94 97.8 F (36.6 C) Oral 81 17 96 % - -  01/18/18 1406 115/73 97.6 F (36.4 C) Oral 68 16 98 % - -  01/18/18 1349 - 97.9 F (36.6 C) - - - - - -  01/18/18 1318 111/63 - - 67 10 96 % - -  01/18/18 1315 - - - 66 11 95 % - -  01/18/18 1304 - - - 65 11 96 % - -  01/18/18 1303 119/75 - - 62 10 95 % - -  01/18/18 1300 - - - 64 10 96 % - -  01/18/18 1249 - - - 69 12 96 % - -  01/18/18 1248 114/67 - - 67 11 95 % - -  01/18/18 1245 - - - 61 (!) 9 95 % - -  01/18/18 1233 114/77 - - 64 11 96 % - -  01/18/18 1230 - - - 64 10 95 % - -  01/18/18 1218 107/63 - - 64 11 95 % - -  01/18/18 1215 - - - 63 11 96 % - -  01/18/18 1203 118/74 - - 65 10 96 % - -  01/18/18 1200 - - - 62 (!) 9 94 % - -  01/18/18 1148 108/61 - - 68 13 96 % - -  01/18/18 1145 - - - 61 11 92 % - -  01/18/18 1134 - - - (!) 58 (!) 9 95 % - -  01/18/18 1133 (!) 115/56 (!) 97.5 F (36.4 C) - 65 12 96 % - -  01/18/18 0753 (!) 145/95 (!) 97.5 F (36.4 C) Oral (!) 59 20 98 % - 76.7 kg (169 lb)    @flow {1959:LAST@   Intake/Output from previous day:   07/29 0701 - 07/30 0700 In: 1550 [P.O.:600; I.V.:850] Out: 25    Intake/Output this shift:   No intake/output data recorded.   Intake/Output      07/29 0701 - 07/30 0700 07/30 0701 - 07/31 0700   P.O. 600    I.V. (mL/kg) 850 (10.6)    IV Piggyback 100    Total Intake(mL/kg) 1550 (19.4)    Urine (mL/kg/hr) 0    Blood 25    Total Output 25    Net +1525         Urine Occurrence 3 x       LABORATORY DATA: Recent Labs    01/19/18 0512  WBC 13.0*  HGB 13.5  HCT 38.4*  PLT 217   Recent Labs    01/19/18 0512  NA 138  K 4.0  CL 102  CO2 28  BUN 17  CREATININE 1.19  GLUCOSE 114*  CALCIUM 8.6*   No results found for: INR, PROTIME  Examination:  General appearance: alert, cooperative and no distress Extremities: extremities normal, atraumatic, no cyanosis or edema  Wound Exam: clean, dry, intact   Drainage:  None: wound tissue dry  Motor Exam: Quadriceps and Hamstrings Intact  Sensory Exam: Superficial Peroneal, Deep Peroneal and Tibial normal   Assessment:    1 Day Post-Op  Procedure(s) (LRB): RIGHT TOTAL KNEE ARTHROPLASTY (Right)  ADDITIONAL DIAGNOSIS:  Active Problems:   S/P total knee replacement     Plan: Physical Therapy as ordered Weight Bearing as Tolerated (WBAT)  DVT Prophylaxis:  Aspirin  DISCHARGE PLAN: Home  DISCHARGE NEEDS: HHPT   Patient doing well but is limited by pain. D/C home either today or tomorrow   Patient's anticipated LOS is less than 2 midnights, meeting these requirements: - Younger than 35 - Lives within 1 hour of care - Has a competent adult at home to recover with post-op recover - NO history of  - Chronic pain requiring opiods  - Diabetes  - Coronary Artery Disease  - Heart failure  - Heart attack  - Stroke  - DVT/VTE  - Cardiac arrhythmia  - Respiratory Failure/COPD  - Renal failure  - Anemia  - Advanced Liver disease              Hector Gonzalez 01/19/2018, 7:01 AM

## 2018-01-19 NOTE — Care Management Note (Signed)
Case Management Note  Patient Details  Name: Mariea StableMickey G Pember MRN: [id number] Date of Birth: 02/14/1966  Subjective/Objective:  52 yr old gentleman s/p right total knee arthroplasty.                   Action/Plan: patient was preoperatively setup with [facility], no changes. DME has been delivered to his home. Patient will have support at discharge.   Expected Discharge Date:    01/20/18              Expected Discharge Plan:  Home w Home Health Services  In-House Referral:  NA  Discharge planning Services  CM Consult  Post Acute Care Choice:  Home Health, Durable Medical Equipment Choice offered to:  Patient  DME Arranged:  Walker rolling, CPM DME Agency:  [employer]  HH Arranged:  PT HH Agency:  [facility] (formerly [facility])  Status of Service:  Completed, signed off  If discussed at [employer] Meetings, dates discussed:    Additional Comments:  Durenda GuthrieBrady, P[place]ley [place] Naomi, RN 01/19/2018, 3:25 PM

## 2018-01-19 NOTE — Progress Notes (Signed)
Physical Therapy Treatment Patient Details Name: Hector Gonzalez MRN: [id number] DOB: 05/10/1966 Today's Date: 01/19/2018    History of Present Illness Hector Gonzalez is a 52 y.o. s/p R TKR. PMH includes HLD, Depression, Opiod dependence, chronic myofascial pain, and paresthesia.     PT Comments    Pt is slow and guarded due to pain but progressing quite well despite his poor pain tolerance.  Will f/u in pm for progression to stair training and further progression through strengthening exercises from HEP.     Follow Up Recommendations  Follow surgeon's recommendation for DC plan and follow-up therapies;Supervision - Intermittent     Equipment Recommendations  3in1 (PT)    Recommendations for Other Services       Precautions / Restrictions Precautions Precautions: Knee Precaution Booklet Issued: Yes (comment) Precaution Comments: Pt educated on precautions.  Restrictions Weight Bearing Restrictions: Yes RLE Weight Bearing: Weight bearing as tolerated    Mobility  Bed Mobility Overal bed mobility: Modified Independent Bed Mobility: Supine to Sit     Supine to sit: Modified independent (Device/Increase time)     General bed mobility comments: No assistance needed patient slow and guarded due to pain.    Transfers Overall transfer level: Needs assistance Equipment used: Rolling walker (2 wheeled) Transfers: Sit to/from Stand Sit to Stand: Min guard         General transfer comment: Cues for hand placement to and from seated surface.  Pt with difficulty extending R knee in standing.    Ambulation/Gait Ambulation/Gait assistance: Min guard Gait Distance (Feet): 200 Feet Assistive device: Rolling walker (2 wheeled) Gait Pattern/deviations: Step-through pattern;Decreased stance time - right;Decreased weight shift to right;Decreased dorsiflexion - right;Antalgic Gait velocity: Decreased   General Gait Details: Cues for R heel strike and knee extension in stance phase.   Knee extension improving as gt progressed.  Pt steady throughout but grimmacing noted due to pain.     Stairs             Wheelchair Mobility    Modified Rankin (Stroke Patients Only)       Balance Overall balance assessment: Needs assistance Sitting-balance support: No upper extremity supported;Feet supported Sitting balance-Leahy Scale: Fair       Standing balance-Leahy Scale: Fair                              Cognition Arousal/Alertness: Awake/alert Behavior During Therapy: WFL for tasks assessed/performed Overall Cognitive Status: Difficult to assess                                 General Comments: Pt seemed alert and pleasant      Exercises Total Joint Exercises Ankle Circles/Pumps: AROM;20 reps;Supine;Both Quad Sets: AROM;Right;10 reps;Supine Heel Slides: AROM;Right;10 reps;Supine Goniometric ROM: 7-76 degrees flexion in R knee.      General Comments        Pertinent Vitals/Pain Pain Assessment: 0-10 Pain Score: 7  Pain Location: R Knee Pain Descriptors / Indicators: Guarding;Grimacing Pain Intervention(s): Monitored during session;Repositioned;Ice applied    Home Living                      Prior Function            PT Goals (current goals can now be found in the care plan section) Acute Rehab PT Goals Patient Stated Goal: To reduce  pain Potential to Achieve Goals: Good Progress towards PT goals: Progressing toward goals    Frequency    7X/week      PT Plan Current plan remains appropriate    Co-evaluation              AM-PAC PT "6 Clicks" Daily Activity  Outcome Measure  Difficulty turning over in bed (including adjusting bedclothes, sheets and blankets)?: Unable Difficulty moving from lying on back to sitting on the side of the bed? : Unable Difficulty sitting down on and standing up from a chair with arms (e.g., wheelchair, bedside commode, etc,.)?: Unable Help needed moving to  and from a bed to chair (including a wheelchair)?: A Little Help needed walking in hospital room?: A Little Help needed climbing 3-5 steps with a railing? : A Lot 6 Click Score: 11    End of Session Equipment Utilized During Treatment: Gait belt Activity Tolerance: Patient tolerated treatment well Patient left: in chair;with call bell/phone within reach;with family/visitor present Nurse Communication: Mobility status PT Visit Diagnosis: Other abnormalities of gait and mobility (R26.89);Unsteadiness on feet (R26.81);Muscle weakness (generalized) (M62.81);Difficulty in walking, not elsewhere classified (R26.2);Pain Pain - Right/Left: Right Pain - part of body: Knee     Time: 1130-1159 PT Time Calculation (min) (ACUTE ONLY): 29 min  Charges:  $Gait Training: 8-22 mins $Therapeutic Activity: 8-22 mins                     Joycelyn Rua, PTA pager [phone]    Florestine Avers 01/19/2018, 1:22 PM

## 2018-01-20 DIAGNOSIS — E785 Hyperlipidemia, unspecified: Secondary | ICD-10-CM | POA: Diagnosis not present

## 2018-01-20 DIAGNOSIS — F329 Major depressive disorder, single episode, unspecified: Secondary | ICD-10-CM | POA: Diagnosis not present

## 2018-01-20 DIAGNOSIS — Z7982 Long term (current) use of aspirin: Secondary | ICD-10-CM | POA: Diagnosis not present

## 2018-01-20 DIAGNOSIS — M1711 Unilateral primary osteoarthritis, right knee: Secondary | ICD-10-CM | POA: Diagnosis not present

## 2018-01-20 DIAGNOSIS — Z79899 Other long term (current) drug therapy: Secondary | ICD-10-CM | POA: Diagnosis not present

## 2018-01-20 DIAGNOSIS — R262 Difficulty in walking, not elsewhere classified: Secondary | ICD-10-CM | POA: Diagnosis not present

## 2018-01-20 LAB — CBC
HCT: 34.8 % — ABNORMAL LOW (ref 39.0–52.0)
Hemoglobin: 11.9 g/dL — ABNORMAL LOW (ref 13.0–17.0)
MCH: 30.3 pg (ref 26.0–34.0)
MCHC: 34.2 g/dL (ref 30.0–36.0)
MCV: 88.5 fL (ref 78.0–100.0)
Platelets: 203 10*3/uL (ref 150–400)
RBC: 3.93 MIL/uL — AB (ref 4.22–5.81)
RDW: 12.1 % (ref 11.5–15.5)
WBC: 12.3 10*3/uL — AB (ref 4.0–10.5)

## 2018-01-20 NOTE — Progress Notes (Signed)
AVS given and reviewed with pt and pt's wife. Printed prescriptions provided. All questions answered to satisfaction. Equipment at bedside. Pt to be escorted off the unit via wheelchair by volunteer services.

## 2018-01-20 NOTE — Progress Notes (Signed)
Physical Therapy Treatment Patient Details Name: Hector Gonzalez MRN: [id number] DOB: 03/21/1966 Today's Date: 01/20/2018    History of Present Illness Hector Gonzalez is a 52 y.o. s/p R TKR. PMH includes HLD, Depression, Opiod dependence, chronic myofascial pain, and paresthesia.     PT Comments    Pt performed gt training and stair training to ensure safe entry into his home.  Pt remains limited due to pain but able to safely perform gt and stair negotiation.  Pt is increased pain post gait and stair training that further therapeutic exercise deferred.  Pt denies need for review with HEP and he was educated on the importance of performing regimen 3x daily.  Informed nursing patient is safe to d/c home from a mobility stand point.       Follow Up Recommendations  Follow surgeon's recommendation for DC plan and follow-up therapies;Supervision - Intermittent     Equipment Recommendations  3in1 (PT)    Recommendations for Other Services       Precautions / Restrictions Precautions Precautions: Knee Precaution Booklet Issued: Yes (comment) Precaution Comments: Pt educated on precautions.  Restrictions Weight Bearing Restrictions: Yes RLE Weight Bearing: Weight bearing as tolerated    Mobility  Bed Mobility Overal bed mobility: Modified Independent       Supine to sit: Modified independent (Device/Increase time) Sit to supine: Modified independent (Device/Increase time)   General bed mobility comments: No assistance needed patient slow and guarded due to pain.    Transfers Overall transfer level: Needs assistance Equipment used: Rolling walker (2 wheeled) Transfers: Sit to/from Stand Sit to Stand: Supervision         General transfer comment: Cues for hand placement to and from seated surface.    Ambulation/Gait Ambulation/Gait assistance: Supervision Gait Distance (Feet): 250 Feet Assistive device: Rolling walker (2 wheeled) Gait Pattern/deviations: Step-through  pattern;Decreased stance time - right;Decreased weight shift to right;Decreased dorsiflexion - right;Antalgic Gait velocity: Decreased   General Gait Details: Cues for R heel strike and knee extension in stance phase.  Knee extension improving as gt progressed.  Pt steady throughout but grimmacing noted due to pain.  Cues for step through pattern and gait symmetry.     Stairs Stairs: Yes Stairs assistance: Supervision Stair Management: One rail Right;Step to pattern Number of Stairs: 6 General stair comments: Cues for sequencing and use of hand rail.     Wheelchair Mobility    Modified Rankin (Stroke Patients Only)       Balance Overall balance assessment: Needs assistance   Sitting balance-Leahy Scale: Good       Standing balance-Leahy Scale: Fair                              Cognition Arousal/Alertness: Awake/alert Behavior During Therapy: WFL for tasks assessed/performed Overall Cognitive Status: Within Functional Limits for tasks assessed                                 General Comments: Pt seemed alert and pleasant      Exercises Total Joint Exercises Goniometric ROM: 64 degrees flexion in R knee.      General Comments        Pertinent Vitals/Pain Pain Assessment: 0-10 Pain Score: 8  Pain Location: R Knee Pain Descriptors / Indicators: Guarding;Grimacing Pain Intervention(s): Monitored during session;Repositioned    Home Living  Prior Function            PT Goals (current goals can now be found in the care plan section) Acute Rehab PT Goals Patient Stated Goal: To reduce pain Potential to Achieve Goals: Good Progress towards PT goals: Progressing toward goals    Frequency    7X/week      PT Plan Current plan remains appropriate    Co-evaluation              AM-PAC PT "6 Clicks" Daily Activity  Outcome Measure  Difficulty turning over in bed (including adjusting  bedclothes, sheets and blankets)?: None Difficulty moving from lying on back to sitting on the side of the bed? : None Difficulty sitting down on and standing up from a chair with arms (e.g., wheelchair, bedside commode, etc,.)?: None Help needed moving to and from a bed to chair (including a wheelchair)?: A Little Help needed walking in hospital room?: A Little Help needed climbing 3-5 steps with a railing? : A Little 6 Click Score: 21    End of Session Equipment Utilized During Treatment: Gait belt Activity Tolerance: Patient tolerated treatment well Patient left: in chair;with call bell/phone within reach;with family/visitor present Nurse Communication: Mobility status PT Visit Diagnosis: Other abnormalities of gait and mobility (R26.89);Unsteadiness on feet (R26.81);Muscle weakness (generalized) (M62.81);Difficulty in walking, not elsewhere classified (R26.2);Pain Pain - Right/Left: Right Pain - part of body: Knee     Time: 5784-6962 PT Time Calculation (min) (ACUTE ONLY): 18 min  Charges:  $Gait Training: 8-22 mins                     Joycelyn Rua, PTA pager [phone]    Florestine Avers 01/20/2018, 6:39 PM

## 2018-01-20 NOTE — Plan of Care (Signed)
Problem: Clinical Measurements: Goal: Will remain free from infection Outcome: Progressing   Problem: Coping: Goal: Level of anxiety will decrease Outcome: Progressing   Problem: Skin Integrity: Goal: Risk for impaired skin integrity will decrease Outcome: Progressing   Problem: Activity: Goal: Ability to avoid complications of mobility impairment will improve Outcome: Progressing   Problem: Clinical Measurements: Goal: Postoperative complications will be avoided or minimized Outcome: Progressing

## 2018-01-20 NOTE — Discharge Summary (Signed)
SPORTS MEDICINE & JOINT REPLACEMENT   Georgena Spurling, MD   Laurier Nancy, PA-C [address] [place], [place], [place]  [address]                             [phone]  PATIENT ID: Hector Gonzalez        MRN:  [id number]          DOB/AGE: 54-Oct-1967 / 54 y.o.    DISCHARGE SUMMARY  ADMISSION DATE:    01/18/2018 DISCHARGE DATE:   01/20/2018   ADMISSION DIAGNOSIS: Osteoarthritis RT. Knee    DISCHARGE DIAGNOSIS:  Osteoarthritis Right Knee    ADDITIONAL DIAGNOSIS: Active Problems:   S/P total knee replacement  Past Medical History:  Diagnosis Date  . Hyperlipidemia   . Nerve pain     PROCEDURE: Procedure(s): RIGHT TOTAL KNEE ARTHROPLASTY on 01/18/2018  CONSULTS:    HISTORY:  See H&P in chart  HOSPITAL COURSE:  Hector Gonzalez is a 52 y.o. admitted on 01/18/2018 and found to have a diagnosis of Osteoarthritis Right Knee.  After appropriate laboratory studies were obtained  they were taken to the operating room on 01/18/2018 and underwent Procedure(s): RIGHT TOTAL KNEE ARTHROPLASTY.   They were given perioperative antibiotics:  Anti-infectives (From admission, onward)   Start     Dose/Rate Route Frequency Ordered Stop   01/18/18 1600  ceFAZolin (ANCEF) IVPB 2g/100 mL premix     2 g 200 mL/hr over 30 Minutes Intravenous Every 6 hours 01/18/18 1154 01/18/18 2308   01/18/18 0900  ceFAZolin (ANCEF) IVPB 2g/100 mL premix     2 g 200 mL/hr over 30 Minutes Intravenous To ShortStay Surgical 01/16/18 1523 01/18/18 0945    .  Patient given tranexamic acid IV or topical and exparel intra-operatively.  Tolerated the procedure well.    POD# 1: Vital signs were stable.  Patient denied Chest pain, shortness of breath, or calf pain.  Patient was started on Lovenox 30 mg subcutaneously twice daily at 8am.  Consults to PT, OT, and care management were made.  The patient was weight bearing as tolerated.  CPM was placed on the operative leg 0-90 degrees for 6-8 hours a day. When out of the CPM,  patient was placed in the foam block to achieve full extension. Incentive spirometry was taught.  Dressing was changed.       POD #2, Continued  PT for ambulation and exercise program.  IV saline locked.  O2 discontinued.    The remainder of the hospital course was dedicated to ambulation and strengthening.   The patient was discharged on 2 Days Post-Op in  Good condition.  Blood products given:none  DIAGNOSTIC STUDIES: Recent vital signs:  Patient Vitals for the past 24 hrs:  BP Temp Temp src Pulse Resp SpO2  01/20/18 0454 (!) 154/98 98.8 F (37.1 C) Oral 88 19 98 %  01/19/18 2035 (!) 165/92 98.6 F (37 C) Oral 89 17 97 %  01/19/18 1359 (!) 178/98 98 F (36.7 C) Oral 76 16 97 %       Recent laboratory studies: Recent Labs    01/19/18 0512 01/20/18 0459  WBC 13.0* 12.3*  HGB 13.5 11.9*  HCT 38.4* 34.8*  PLT 217 203   Recent Labs    01/19/18 0512  NA 138  K 4.0  CL 102  CO2 28  BUN 17  CREATININE 1.19  GLUCOSE 114*  CALCIUM 8.6*   No  results found for: INR, PROTIME   Recent Radiographic Studies :  No results found.  DISCHARGE INSTRUCTIONS: Discharge Instructions    CPM   Complete by:  As directed    Continuous passive motion machine (CPM):      Use the CPM from 0 to 90 for 4-6 hours per day.      You may increase by 10 per day.  You may break it up into 2 or 3 sessions per day.      Use CPM for 2 weeks or until you are told to stop.   Call MD / Call 911   Complete by:  As directed    If you experience chest pain or shortness of breath, CALL 911 and be transported to the hospital emergency room.  If you develope a fever above 101 F, pus (white drainage) or increased drainage or redness at the wound, or calf pain, call your surgeon's office.   Constipation Prevention   Complete by:  As directed    Drink plenty of fluids.  Prune juice may be helpful.  You may use a stool softener, such as Colace (over the counter) 100 mg twice a day.  Use MiraLax (over the  counter) for constipation as needed.   Diet - low sodium heart healthy   Complete by:  As directed    Discharge instructions   Complete by:  As directed    INSTRUCTIONS AFTER JOINT REPLACEMENT   Remove items at home which could result in a fall. This includes throw rugs or furniture in walking pathways ICE to the affected joint every three hours while awake for 30 minutes at a time, for at least the first 3-5 days, and then as needed for pain and swelling.  Continue to use ice for pain and swelling. You may notice swelling that will progress down to the foot and ankle.  This is normal after surgery.  Elevate your leg when you are not up walking on it.   Continue to use the breathing machine you got in the hospital (incentive spirometer) which will help keep your temperature down.  It is common for your temperature to cycle up and down following surgery, especially at night when you are not up moving around and exerting yourself.  The breathing machine keeps your lungs expanded and your temperature down.   DIET:  As you were doing prior to hospitalization, we recommend a well-balanced diet.  DRESSING / WOUND CARE / SHOWERING  Keep the surgical dressing until follow up.  The dressing is water proof, so you can shower without any extra covering.  IF THE DRESSING FALLS OFF or the wound gets wet inside, change the dressing with sterile gauze.  Please use good hand washing techniques before changing the dressing.  Do not use any lotions or creams on the incision until instructed by your surgeon.    ACTIVITY  Increase activity slowly as tolerated, but follow the weight bearing instructions below.   No driving for 6 weeks or until further direction given by your physician.  You cannot drive while taking narcotics.  No lifting or carrying greater than 10 lbs. until further directed by your surgeon. Avoid periods of inactivity such as sitting longer than an hour when not asleep. This helps prevent blood  clots.  You may return to work once you are authorized by your doctor.     WEIGHT BEARING   Weight bearing as tolerated with assist device (walker, cane, etc) as directed, use it  as long as suggested by your surgeon or therapist, typically at least 4-6 weeks.   EXERCISES  Results after joint replacement surgery are often greatly improved when you follow the exercise, range of motion and muscle strengthening exercises prescribed by your doctor. Safety measures are also important to protect the joint from further injury. Any time any of these exercises cause you to have increased pain or swelling, decrease what you are doing until you are comfortable again and then slowly increase them. If you have problems or questions, call your caregiver or physical therapist for advice.   Rehabilitation is important following a joint replacement. After just a few days of immobilization, the muscles of the leg can become weakened and shrink (atrophy).  These exercises are designed to build up the tone and strength of the thigh and leg muscles and to improve motion. Often times heat used for twenty to thirty minutes before working out will loosen up your tissues and help with improving the range of motion but do not use heat for the first two weeks following surgery (sometimes heat can increase post-operative swelling).   These exercises can be done on a training (exercise) mat, on the floor, on a table or on a bed. Use whatever works the best and is most comfortable for you.    Use music or television while you are exercising so that the exercises are a pleasant break in your day. This will make your life better with the exercises acting as a break in your routine that you can look forward to.   Perform all exercises about fifteen times, three times per day or as directed.  You should exercise both the operative leg and the other leg as well.   Exercises include:   Quad Sets - Tighten up the muscle on the front  of the thigh (Quad) and hold for 5-10 seconds.   Straight Leg Raises - With your knee straight (if you were given a brace, keep it on), lift the leg to 60 degrees, hold for 3 seconds, and slowly lower the leg.  Perform this exercise against resistance later as your leg gets stronger.  Leg Slides: Lying on your back, slowly slide your foot toward your buttocks, bending your knee up off the floor (only go as far as is comfortable). Then slowly slide your foot back down until your leg is flat on the floor again.  Angel Wings: Lying on your back spread your legs to the side as far apart as you can without causing discomfort.  Hamstring Strength:  Lying on your back, push your heel against the floor with your leg straight by tightening up the muscles of your buttocks.  Repeat, but this time bend your knee to a comfortable angle, and push your heel against the floor.  You may put a pillow under the heel to make it more comfortable if necessary.   A rehabilitation program following joint replacement surgery can speed recovery and prevent re-injury in the future due to weakened muscles. Contact your doctor or a physical therapist for more information on knee rehabilitation.    CONSTIPATION  Constipation is defined medically as fewer than three stools per week and severe constipation as less than one stool per week.  Even if you have a regular bowel pattern at home, your normal regimen is likely to be disrupted due to multiple reasons following surgery.  Combination of anesthesia, postoperative narcotics, change in appetite and fluid intake all can affect your bowels.  YOU MUST use at least one of the following options; they are listed in order of increasing strength to get the job done.  They are all available over the counter, and you may need to use some, POSSIBLY even all of these options:    Drink plenty of fluids (prune juice may be helpful) and high fiber foods Colace 100 mg by mouth twice a day   Senokot for constipation as directed and as needed Dulcolax (bisacodyl), take with full glass of water  Miralax (polyethylene glycol) once or twice a day as needed.  If you have tried all these things and are unable to have a bowel movement in the first 3-4 days after surgery call either your surgeon or your primary doctor.    If you experience loose stools or diarrhea, hold the medications until you stool forms back up.  If your symptoms do not get better within 1 week or if they get worse, check with your doctor.  If you experience "the worst abdominal pain ever" or develop nausea or vomiting, please contact the office immediately for further recommendations for treatment.   ITCHING:  If you experience itching with your medications, try taking only a single pain pill, or even half a pain pill at a time.  You can also use Benadryl over the counter for itching or also to help with sleep.   TED HOSE STOCKINGS:  Use stockings on both legs until for at least 2 weeks or as directed by physician office. They may be removed at night for sleeping.  MEDICATIONS:  See your medication summary on the "After Visit Summary" that nursing will review with you.  You may have some home medications which will be placed on hold until you complete the course of blood thinner medication.  It is important for you to complete the blood thinner medication as prescribed.  PRECAUTIONS:  If you experience chest pain or shortness of breath - call 911 immediately for transfer to the hospital emergency department.   If you develop a fever greater that 101 F, purulent drainage from wound, increased redness or drainage from wound, foul odor from the wound/dressing, or calf pain - CONTACT YOUR SURGEON.                                                   FOLLOW-UP APPOINTMENTS:  If you do not already have a post-op appointment, please call the office for an appointment to be seen by your surgeon.  Guidelines for how soon to be seen  are listed in your "After Visit Summary", but are typically between 1-4 weeks after surgery.  OTHER INSTRUCTIONS:   Knee Replacement:  Do not place pillow under knee, focus on keeping the knee straight while resting. CPM instructions: 0-90 degrees, 2 hours in the morning, 2 hours in the afternoon, and 2 hours in the evening. Place foam block, curve side up under heel at all times except when in CPM or when walking.  DO NOT modify, tear, cut, or change the foam block in any way.  MAKE SURE YOU:  Understand these instructions.  Get help right away if you are not doing well or get worse.    Thank you for letting us be a part of your medical care team.  It is a privilege we respect greatly.  We hope these instructions  will help you stay on track for a fast and full recovery!   Increase activity slowly as tolerated   Complete by:  As directed       DISCHARGE MEDICATIONS:   Allergies as of 01/20/2018   No Known Allergies     Medication List    STOP taking these medications   oxymorphone 5 MG 12 hr tablet Commonly known as:  OPANA ER   Oxymorphone ER (Crush Resist) 15 MG T12a Commonly known as:  OPANA ER (CRUSH RESISTANT)     TAKE these medications   acetaminophen 500 MG tablet Commonly known as:  TYLENOL Take 2 tablets (1,000 mg total) by mouth every 6 (six) hours.   aspirin 325 MG EC tablet Take 1 tablet (325 mg total) by mouth 2 (two) times daily.   b complex vitamins capsule Take 1 capsule by mouth 2 (two) times daily.   busPIRone 5 MG tablet Commonly known as:  BUSPAR Take 5 mg by mouth 2 (two) times daily.   COSAMIN DS PO Take 1 tablet by mouth daily.   diphenhydramine-acetaminophen 25-500 MG Tabs tablet Commonly known as:  TYLENOL PM Take 1 tablet by mouth at bedtime.   DULoxetine 30 MG capsule Commonly known as:  CYMBALTA Take 30 mg by mouth at bedtime.   EQUALACTIN 625 MG Chew Generic drug:  Calcium Polycarbophil Chew 1 tablet by mouth at bedtime.    gabapentin 300 MG capsule Commonly known as:  NEURONTIN Take 600 mg by mouth 3 (three) times daily.   HYDROmorphone 2 MG tablet Commonly known as:  DILAUDID Take 1 tablet (2 mg total) by mouth every 6 (six) hours as needed for severe pain (severe "breakthrough" pain score of 10).   methocarbamol 500 MG tablet Commonly known as:  ROBAXIN Take 1-2 tablets (500-1,000 mg total) by mouth every 6 (six) hours as needed for muscle spasms.   OXYCONTIN 10 mg 12 hr tablet Generic drug:  oxyCODONE Take 10 mg by mouth 2 (two) times daily. What changed:  Another medication with the same name was added. Make sure you understand how and when to take each.   Oxycodone HCl 10 MG Tabs Take 1 tablet (10 mg total) by mouth every 6 (six) hours as needed for moderate pain or severe pain (moderate to severe pain). What changed:  You were already taking a medication with the same name, and this prescription was added. Make sure you understand how and when to take each.   Vitamin C Chew Chew 1 tablet by mouth daily with lunch.   ZINC PO Take 1 tablet by mouth daily with lunch.            Durable Medical Equipment  (From admission, onward)        Start     Ordered   01/18/18 1351  DME Walker rolling  Once    Question:  Patient needs a walker to treat with the following condition  Answer:  S/P total knee replacement   01/18/18 1350   01/18/18 1351  DME 3 n 1  Once     01/18/18 1350   01/18/18 1351  DME Bedside commode  Once    Question:  Patient needs a bedside commode to treat with the following condition  Answer:  S/P total knee replacement   01/18/18 1350      FOLLOW UP VISIT:   Follow-up Information    [facility] Follow up.   Specialty:  Home Health Services Why:  A  representative from [facility] will contact you to arrange start date and time for your therapy. Contact information: [address] [place] [address] [place] [id number] [phone]           DISPOSITION:  HOME VS. SNF  CONDITION:  Good   Guy SandiferColby Alan Robbins 01/20/2018, 12:16 PM

## 2018-01-21 ENCOUNTER — Encounter (HOSPITAL_COMMUNITY): Payer: Self-pay | Admitting: Orthopedic Surgery

## 2018-01-21 DIAGNOSIS — Z96651 Presence of right artificial knee joint: Secondary | ICD-10-CM | POA: Diagnosis not present

## 2018-01-21 DIAGNOSIS — E785 Hyperlipidemia, unspecified: Secondary | ICD-10-CM | POA: Diagnosis not present

## 2018-01-21 DIAGNOSIS — M791 Myalgia, unspecified site: Secondary | ICD-10-CM | POA: Diagnosis not present

## 2018-01-21 DIAGNOSIS — H9392 Unspecified disorder of left ear: Secondary | ICD-10-CM | POA: Diagnosis not present

## 2018-01-21 DIAGNOSIS — J309 Allergic rhinitis, unspecified: Secondary | ICD-10-CM | POA: Diagnosis not present

## 2018-01-21 DIAGNOSIS — Z9181 History of falling: Secondary | ICD-10-CM | POA: Diagnosis not present

## 2018-01-21 DIAGNOSIS — Z471 Aftercare following joint replacement surgery: Secondary | ICD-10-CM | POA: Diagnosis not present

## 2018-01-21 DIAGNOSIS — F1120 Opioid dependence, uncomplicated: Secondary | ICD-10-CM | POA: Diagnosis not present

## 2018-01-21 DIAGNOSIS — G8929 Other chronic pain: Secondary | ICD-10-CM | POA: Diagnosis not present

## 2018-01-21 DIAGNOSIS — F329 Major depressive disorder, single episode, unspecified: Secondary | ICD-10-CM | POA: Diagnosis not present

## 2018-01-21 DIAGNOSIS — Z7982 Long term (current) use of aspirin: Secondary | ICD-10-CM | POA: Diagnosis not present

## 2018-01-21 NOTE — Anesthesia Postprocedure Evaluation (Signed)
Anesthesia Post Note  Patient: Hector Gonzalez  Procedure(s) Performed: RIGHT TOTAL KNEE ARTHROPLASTY (Right Knee)     Patient location during evaluation: PACU Anesthesia Type: MAC, Regional and Spinal Level of consciousness: awake and alert Pain management: pain level controlled Vital Signs Assessment: post-procedure vital signs reviewed and stable Respiratory status: spontaneous breathing, nonlabored ventilation, respiratory function stable and patient connected to nasal cannula oxygen Cardiovascular status: stable and blood pressure returned to baseline Postop Assessment: no apparent nausea or vomiting and spinal receding Anesthetic complications: no    Last Vitals:  Vitals:   01/19/18 2035 01/20/18 0454  BP: (!) 165/92 (!) 154/98  Pulse: 89 88  Resp: 17 19  Temp: 37 C 37.1 C  SpO2: 97% 98%    Last Pain:  Vitals:   01/20/18 1201  TempSrc:   PainSc: 8                  Antoni Stefan

## 2018-01-25 DIAGNOSIS — H9392 Unspecified disorder of left ear: Secondary | ICD-10-CM | POA: Diagnosis not present

## 2018-01-25 DIAGNOSIS — E785 Hyperlipidemia, unspecified: Secondary | ICD-10-CM | POA: Diagnosis not present

## 2018-01-25 DIAGNOSIS — G8929 Other chronic pain: Secondary | ICD-10-CM | POA: Diagnosis not present

## 2018-01-25 DIAGNOSIS — Z7982 Long term (current) use of aspirin: Secondary | ICD-10-CM | POA: Diagnosis not present

## 2018-01-25 DIAGNOSIS — Z96651 Presence of right artificial knee joint: Secondary | ICD-10-CM | POA: Diagnosis not present

## 2018-01-25 DIAGNOSIS — M545 Low back pain: Secondary | ICD-10-CM | POA: Diagnosis not present

## 2018-01-25 DIAGNOSIS — G894 Chronic pain syndrome: Secondary | ICD-10-CM | POA: Diagnosis not present

## 2018-01-25 DIAGNOSIS — Z471 Aftercare following joint replacement surgery: Secondary | ICD-10-CM | POA: Diagnosis not present

## 2018-01-25 DIAGNOSIS — F329 Major depressive disorder, single episode, unspecified: Secondary | ICD-10-CM | POA: Diagnosis not present

## 2018-01-25 DIAGNOSIS — G629 Polyneuropathy, unspecified: Secondary | ICD-10-CM | POA: Diagnosis not present

## 2018-01-25 DIAGNOSIS — Z9181 History of falling: Secondary | ICD-10-CM | POA: Diagnosis not present

## 2018-01-25 DIAGNOSIS — J309 Allergic rhinitis, unspecified: Secondary | ICD-10-CM | POA: Diagnosis not present

## 2018-01-25 DIAGNOSIS — F1120 Opioid dependence, uncomplicated: Secondary | ICD-10-CM | POA: Diagnosis not present

## 2018-01-25 DIAGNOSIS — M791 Myalgia, unspecified site: Secondary | ICD-10-CM | POA: Diagnosis not present

## 2018-01-26 DIAGNOSIS — G8929 Other chronic pain: Secondary | ICD-10-CM | POA: Diagnosis not present

## 2018-01-26 DIAGNOSIS — F329 Major depressive disorder, single episode, unspecified: Secondary | ICD-10-CM | POA: Diagnosis not present

## 2018-01-26 DIAGNOSIS — Z7982 Long term (current) use of aspirin: Secondary | ICD-10-CM | POA: Diagnosis not present

## 2018-01-26 DIAGNOSIS — F1120 Opioid dependence, uncomplicated: Secondary | ICD-10-CM | POA: Diagnosis not present

## 2018-01-26 DIAGNOSIS — M791 Myalgia, unspecified site: Secondary | ICD-10-CM | POA: Diagnosis not present

## 2018-01-26 DIAGNOSIS — J309 Allergic rhinitis, unspecified: Secondary | ICD-10-CM | POA: Diagnosis not present

## 2018-01-26 DIAGNOSIS — H9392 Unspecified disorder of left ear: Secondary | ICD-10-CM | POA: Diagnosis not present

## 2018-01-26 DIAGNOSIS — Z471 Aftercare following joint replacement surgery: Secondary | ICD-10-CM | POA: Diagnosis not present

## 2018-01-26 DIAGNOSIS — Z96651 Presence of right artificial knee joint: Secondary | ICD-10-CM | POA: Diagnosis not present

## 2018-01-26 DIAGNOSIS — E785 Hyperlipidemia, unspecified: Secondary | ICD-10-CM | POA: Diagnosis not present

## 2018-01-26 DIAGNOSIS — Z9181 History of falling: Secondary | ICD-10-CM | POA: Diagnosis not present

## 2018-01-27 DIAGNOSIS — F329 Major depressive disorder, single episode, unspecified: Secondary | ICD-10-CM | POA: Diagnosis not present

## 2018-01-27 DIAGNOSIS — Z9181 History of falling: Secondary | ICD-10-CM | POA: Diagnosis not present

## 2018-01-27 DIAGNOSIS — M791 Myalgia, unspecified site: Secondary | ICD-10-CM | POA: Diagnosis not present

## 2018-01-27 DIAGNOSIS — G8929 Other chronic pain: Secondary | ICD-10-CM | POA: Diagnosis not present

## 2018-01-27 DIAGNOSIS — F1120 Opioid dependence, uncomplicated: Secondary | ICD-10-CM | POA: Diagnosis not present

## 2018-01-27 DIAGNOSIS — Z7982 Long term (current) use of aspirin: Secondary | ICD-10-CM | POA: Diagnosis not present

## 2018-01-27 DIAGNOSIS — H9392 Unspecified disorder of left ear: Secondary | ICD-10-CM | POA: Diagnosis not present

## 2018-01-27 DIAGNOSIS — Z96651 Presence of right artificial knee joint: Secondary | ICD-10-CM | POA: Diagnosis not present

## 2018-01-27 DIAGNOSIS — E785 Hyperlipidemia, unspecified: Secondary | ICD-10-CM | POA: Diagnosis not present

## 2018-01-27 DIAGNOSIS — Z471 Aftercare following joint replacement surgery: Secondary | ICD-10-CM | POA: Diagnosis not present

## 2018-01-27 DIAGNOSIS — J309 Allergic rhinitis, unspecified: Secondary | ICD-10-CM | POA: Diagnosis not present

## 2018-01-28 DIAGNOSIS — E785 Hyperlipidemia, unspecified: Secondary | ICD-10-CM | POA: Diagnosis not present

## 2018-01-28 DIAGNOSIS — F329 Major depressive disorder, single episode, unspecified: Secondary | ICD-10-CM | POA: Diagnosis not present

## 2018-01-28 DIAGNOSIS — Z9181 History of falling: Secondary | ICD-10-CM | POA: Diagnosis not present

## 2018-01-28 DIAGNOSIS — Z7982 Long term (current) use of aspirin: Secondary | ICD-10-CM | POA: Diagnosis not present

## 2018-01-28 DIAGNOSIS — Z96651 Presence of right artificial knee joint: Secondary | ICD-10-CM | POA: Diagnosis not present

## 2018-01-28 DIAGNOSIS — M791 Myalgia, unspecified site: Secondary | ICD-10-CM | POA: Diagnosis not present

## 2018-01-28 DIAGNOSIS — M25461 Effusion, right knee: Secondary | ICD-10-CM | POA: Diagnosis not present

## 2018-01-28 DIAGNOSIS — H9392 Unspecified disorder of left ear: Secondary | ICD-10-CM | POA: Diagnosis not present

## 2018-01-28 DIAGNOSIS — F1120 Opioid dependence, uncomplicated: Secondary | ICD-10-CM | POA: Diagnosis not present

## 2018-01-28 DIAGNOSIS — G8929 Other chronic pain: Secondary | ICD-10-CM | POA: Diagnosis not present

## 2018-01-28 DIAGNOSIS — Z471 Aftercare following joint replacement surgery: Secondary | ICD-10-CM | POA: Diagnosis not present

## 2018-01-28 DIAGNOSIS — J309 Allergic rhinitis, unspecified: Secondary | ICD-10-CM | POA: Diagnosis not present

## 2018-01-29 DIAGNOSIS — M25561 Pain in right knee: Secondary | ICD-10-CM | POA: Diagnosis not present

## 2018-01-29 DIAGNOSIS — Z96651 Presence of right artificial knee joint: Secondary | ICD-10-CM | POA: Diagnosis not present

## 2018-02-02 DIAGNOSIS — Z96651 Presence of right artificial knee joint: Secondary | ICD-10-CM | POA: Diagnosis not present

## 2018-02-02 DIAGNOSIS — M25561 Pain in right knee: Secondary | ICD-10-CM | POA: Diagnosis not present

## 2018-02-04 DIAGNOSIS — Z96651 Presence of right artificial knee joint: Secondary | ICD-10-CM | POA: Diagnosis not present

## 2018-02-04 DIAGNOSIS — M25561 Pain in right knee: Secondary | ICD-10-CM | POA: Diagnosis not present

## 2018-02-08 DIAGNOSIS — Z125 Encounter for screening for malignant neoplasm of prostate: Secondary | ICD-10-CM | POA: Diagnosis not present

## 2018-02-08 DIAGNOSIS — F411 Generalized anxiety disorder: Secondary | ICD-10-CM | POA: Diagnosis not present

## 2018-02-08 DIAGNOSIS — R030 Elevated blood-pressure reading, without diagnosis of hypertension: Secondary | ICD-10-CM | POA: Diagnosis not present

## 2018-02-08 DIAGNOSIS — Z0000 Encounter for general adult medical examination without abnormal findings: Secondary | ICD-10-CM | POA: Diagnosis not present

## 2018-02-09 DIAGNOSIS — Z96651 Presence of right artificial knee joint: Secondary | ICD-10-CM | POA: Diagnosis not present

## 2018-02-09 DIAGNOSIS — M25561 Pain in right knee: Secondary | ICD-10-CM | POA: Diagnosis not present

## 2018-02-11 DIAGNOSIS — Z96651 Presence of right artificial knee joint: Secondary | ICD-10-CM | POA: Diagnosis not present

## 2018-02-11 DIAGNOSIS — M25561 Pain in right knee: Secondary | ICD-10-CM | POA: Diagnosis not present

## 2018-02-16 DIAGNOSIS — Z96651 Presence of right artificial knee joint: Secondary | ICD-10-CM | POA: Diagnosis not present

## 2018-02-16 DIAGNOSIS — M25561 Pain in right knee: Secondary | ICD-10-CM | POA: Diagnosis not present

## 2018-02-18 DIAGNOSIS — Z96651 Presence of right artificial knee joint: Secondary | ICD-10-CM | POA: Diagnosis not present

## 2018-02-18 DIAGNOSIS — M25561 Pain in right knee: Secondary | ICD-10-CM | POA: Diagnosis not present

## 2018-02-23 DIAGNOSIS — Z96651 Presence of right artificial knee joint: Secondary | ICD-10-CM | POA: Diagnosis not present

## 2018-02-23 DIAGNOSIS — M25561 Pain in right knee: Secondary | ICD-10-CM | POA: Diagnosis not present

## 2018-02-25 DIAGNOSIS — M25561 Pain in right knee: Secondary | ICD-10-CM | POA: Diagnosis not present

## 2018-02-25 DIAGNOSIS — Z96651 Presence of right artificial knee joint: Secondary | ICD-10-CM | POA: Diagnosis not present

## 2018-03-02 DIAGNOSIS — Z96651 Presence of right artificial knee joint: Secondary | ICD-10-CM | POA: Diagnosis not present

## 2018-03-02 DIAGNOSIS — M545 Low back pain: Secondary | ICD-10-CM | POA: Diagnosis not present

## 2018-03-02 DIAGNOSIS — G894 Chronic pain syndrome: Secondary | ICD-10-CM | POA: Diagnosis not present

## 2018-03-02 DIAGNOSIS — F1120 Opioid dependence, uncomplicated: Secondary | ICD-10-CM | POA: Diagnosis not present

## 2018-03-02 DIAGNOSIS — M25561 Pain in right knee: Secondary | ICD-10-CM | POA: Diagnosis not present

## 2018-03-02 DIAGNOSIS — G629 Polyneuropathy, unspecified: Secondary | ICD-10-CM | POA: Diagnosis not present

## 2018-03-04 DIAGNOSIS — M25561 Pain in right knee: Secondary | ICD-10-CM | POA: Diagnosis not present

## 2018-03-04 DIAGNOSIS — Z96651 Presence of right artificial knee joint: Secondary | ICD-10-CM | POA: Diagnosis not present

## 2018-03-09 DIAGNOSIS — M25561 Pain in right knee: Secondary | ICD-10-CM | POA: Diagnosis not present

## 2018-03-09 DIAGNOSIS — Z96651 Presence of right artificial knee joint: Secondary | ICD-10-CM | POA: Diagnosis not present

## 2018-03-11 DIAGNOSIS — Z96651 Presence of right artificial knee joint: Secondary | ICD-10-CM | POA: Diagnosis not present

## 2018-03-11 DIAGNOSIS — M25561 Pain in right knee: Secondary | ICD-10-CM | POA: Diagnosis not present

## 2018-03-16 DIAGNOSIS — M25561 Pain in right knee: Secondary | ICD-10-CM | POA: Diagnosis not present

## 2018-03-16 DIAGNOSIS — Z96651 Presence of right artificial knee joint: Secondary | ICD-10-CM | POA: Diagnosis not present

## 2018-03-19 DIAGNOSIS — M25561 Pain in right knee: Secondary | ICD-10-CM | POA: Diagnosis not present

## 2018-03-19 DIAGNOSIS — Z96651 Presence of right artificial knee joint: Secondary | ICD-10-CM | POA: Diagnosis not present

## 2018-03-22 DIAGNOSIS — M25561 Pain in right knee: Secondary | ICD-10-CM | POA: Diagnosis not present

## 2018-03-22 DIAGNOSIS — Z96651 Presence of right artificial knee joint: Secondary | ICD-10-CM | POA: Diagnosis not present

## 2018-03-30 DIAGNOSIS — G629 Polyneuropathy, unspecified: Secondary | ICD-10-CM | POA: Diagnosis not present

## 2018-03-30 DIAGNOSIS — M25561 Pain in right knee: Secondary | ICD-10-CM | POA: Diagnosis not present

## 2018-03-30 DIAGNOSIS — F1120 Opioid dependence, uncomplicated: Secondary | ICD-10-CM | POA: Diagnosis not present

## 2018-03-30 DIAGNOSIS — Z79899 Other long term (current) drug therapy: Secondary | ICD-10-CM | POA: Diagnosis not present

## 2018-03-30 DIAGNOSIS — M545 Low back pain: Secondary | ICD-10-CM | POA: Diagnosis not present

## 2018-03-30 DIAGNOSIS — G894 Chronic pain syndrome: Secondary | ICD-10-CM | POA: Diagnosis not present

## 2018-03-30 DIAGNOSIS — Z96651 Presence of right artificial knee joint: Secondary | ICD-10-CM | POA: Diagnosis not present

## 2018-04-01 DIAGNOSIS — M25561 Pain in right knee: Secondary | ICD-10-CM | POA: Diagnosis not present

## 2018-04-01 DIAGNOSIS — Z96651 Presence of right artificial knee joint: Secondary | ICD-10-CM | POA: Diagnosis not present

## 2018-04-06 DIAGNOSIS — M25561 Pain in right knee: Secondary | ICD-10-CM | POA: Diagnosis not present

## 2018-04-06 DIAGNOSIS — Z96651 Presence of right artificial knee joint: Secondary | ICD-10-CM | POA: Diagnosis not present

## 2018-04-08 DIAGNOSIS — Z96651 Presence of right artificial knee joint: Secondary | ICD-10-CM | POA: Diagnosis not present

## 2018-04-08 DIAGNOSIS — M25561 Pain in right knee: Secondary | ICD-10-CM | POA: Diagnosis not present

## 2018-04-13 DIAGNOSIS — Z96651 Presence of right artificial knee joint: Secondary | ICD-10-CM | POA: Diagnosis not present

## 2018-04-13 DIAGNOSIS — M25561 Pain in right knee: Secondary | ICD-10-CM | POA: Diagnosis not present

## 2018-04-15 DIAGNOSIS — M25561 Pain in right knee: Secondary | ICD-10-CM | POA: Diagnosis not present

## 2018-04-15 DIAGNOSIS — Z96651 Presence of right artificial knee joint: Secondary | ICD-10-CM | POA: Diagnosis not present

## 2018-04-20 DIAGNOSIS — M25561 Pain in right knee: Secondary | ICD-10-CM | POA: Diagnosis not present

## 2018-04-20 DIAGNOSIS — Z96651 Presence of right artificial knee joint: Secondary | ICD-10-CM | POA: Diagnosis not present

## 2018-04-21 DIAGNOSIS — M2141 Flat foot [pes planus] (acquired), right foot: Secondary | ICD-10-CM | POA: Diagnosis not present

## 2018-04-26 DIAGNOSIS — G894 Chronic pain syndrome: Secondary | ICD-10-CM | POA: Diagnosis not present

## 2018-04-26 DIAGNOSIS — F1120 Opioid dependence, uncomplicated: Secondary | ICD-10-CM | POA: Diagnosis not present

## 2018-04-26 DIAGNOSIS — M545 Low back pain: Secondary | ICD-10-CM | POA: Diagnosis not present

## 2018-04-26 DIAGNOSIS — G629 Polyneuropathy, unspecified: Secondary | ICD-10-CM | POA: Diagnosis not present

## 2018-04-27 DIAGNOSIS — Z96651 Presence of right artificial knee joint: Secondary | ICD-10-CM | POA: Diagnosis not present

## 2018-04-27 DIAGNOSIS — M25561 Pain in right knee: Secondary | ICD-10-CM | POA: Diagnosis not present

## 2018-05-24 DIAGNOSIS — M545 Low back pain: Secondary | ICD-10-CM | POA: Diagnosis not present

## 2018-05-24 DIAGNOSIS — F1120 Opioid dependence, uncomplicated: Secondary | ICD-10-CM | POA: Diagnosis not present

## 2018-05-24 DIAGNOSIS — Z79899 Other long term (current) drug therapy: Secondary | ICD-10-CM | POA: Diagnosis not present

## 2018-05-24 DIAGNOSIS — G629 Polyneuropathy, unspecified: Secondary | ICD-10-CM | POA: Diagnosis not present

## 2018-05-24 DIAGNOSIS — G894 Chronic pain syndrome: Secondary | ICD-10-CM | POA: Diagnosis not present

## 2018-05-26 DIAGNOSIS — E782 Mixed hyperlipidemia: Secondary | ICD-10-CM | POA: Diagnosis not present

## 2018-05-26 DIAGNOSIS — Z862 Personal history of diseases of the blood and blood-forming organs and certain disorders involving the immune mechanism: Secondary | ICD-10-CM | POA: Diagnosis not present

## 2018-06-01 DIAGNOSIS — H7112 Cholesteatoma of tympanum, left ear: Secondary | ICD-10-CM | POA: Diagnosis not present

## 2018-06-21 DIAGNOSIS — H7112 Cholesteatoma of tympanum, left ear: Secondary | ICD-10-CM | POA: Diagnosis not present

## 2018-06-29 DIAGNOSIS — G8929 Other chronic pain: Secondary | ICD-10-CM | POA: Diagnosis not present

## 2018-06-29 DIAGNOSIS — F1120 Opioid dependence, uncomplicated: Secondary | ICD-10-CM | POA: Diagnosis not present

## 2018-06-29 DIAGNOSIS — Z79899 Other long term (current) drug therapy: Secondary | ICD-10-CM | POA: Diagnosis not present

## 2018-06-29 DIAGNOSIS — M545 Low back pain: Secondary | ICD-10-CM | POA: Diagnosis not present

## 2018-06-29 DIAGNOSIS — F17210 Nicotine dependence, cigarettes, uncomplicated: Secondary | ICD-10-CM | POA: Diagnosis not present

## 2018-06-29 DIAGNOSIS — G629 Polyneuropathy, unspecified: Secondary | ICD-10-CM | POA: Diagnosis not present

## 2018-07-23 DIAGNOSIS — E782 Mixed hyperlipidemia: Secondary | ICD-10-CM | POA: Diagnosis not present

## 2018-07-23 DIAGNOSIS — F411 Generalized anxiety disorder: Secondary | ICD-10-CM | POA: Diagnosis not present

## 2018-07-23 DIAGNOSIS — Z862 Personal history of diseases of the blood and blood-forming organs and certain disorders involving the immune mechanism: Secondary | ICD-10-CM | POA: Diagnosis not present

## 2018-07-23 DIAGNOSIS — I10 Essential (primary) hypertension: Secondary | ICD-10-CM | POA: Diagnosis not present

## 2018-07-26 DIAGNOSIS — F1120 Opioid dependence, uncomplicated: Secondary | ICD-10-CM | POA: Diagnosis not present

## 2018-07-26 DIAGNOSIS — G8929 Other chronic pain: Secondary | ICD-10-CM | POA: Diagnosis not present

## 2018-07-26 DIAGNOSIS — M545 Low back pain: Secondary | ICD-10-CM | POA: Diagnosis not present

## 2018-07-26 DIAGNOSIS — Z79899 Other long term (current) drug therapy: Secondary | ICD-10-CM | POA: Diagnosis not present

## 2018-07-26 DIAGNOSIS — G629 Polyneuropathy, unspecified: Secondary | ICD-10-CM | POA: Diagnosis not present

## 2018-08-30 DIAGNOSIS — Z79899 Other long term (current) drug therapy: Secondary | ICD-10-CM | POA: Diagnosis not present

## 2018-08-30 DIAGNOSIS — M545 Low back pain: Secondary | ICD-10-CM | POA: Diagnosis not present

## 2018-08-30 DIAGNOSIS — F1120 Opioid dependence, uncomplicated: Secondary | ICD-10-CM | POA: Diagnosis not present

## 2018-08-30 DIAGNOSIS — G8929 Other chronic pain: Secondary | ICD-10-CM | POA: Diagnosis not present

## 2018-08-30 DIAGNOSIS — G629 Polyneuropathy, unspecified: Secondary | ICD-10-CM | POA: Diagnosis not present

## 2018-09-27 DIAGNOSIS — G894 Chronic pain syndrome: Secondary | ICD-10-CM | POA: Diagnosis not present

## 2018-09-27 DIAGNOSIS — G629 Polyneuropathy, unspecified: Secondary | ICD-10-CM | POA: Diagnosis not present

## 2018-09-27 DIAGNOSIS — F1120 Opioid dependence, uncomplicated: Secondary | ICD-10-CM | POA: Diagnosis not present

## 2018-09-27 DIAGNOSIS — M545 Low back pain: Secondary | ICD-10-CM | POA: Diagnosis not present

## 2018-10-05 DIAGNOSIS — Z96651 Presence of right artificial knee joint: Secondary | ICD-10-CM | POA: Diagnosis not present

## 2018-10-25 DIAGNOSIS — M545 Low back pain: Secondary | ICD-10-CM | POA: Diagnosis not present

## 2018-10-25 DIAGNOSIS — F1120 Opioid dependence, uncomplicated: Secondary | ICD-10-CM | POA: Diagnosis not present

## 2018-10-25 DIAGNOSIS — G894 Chronic pain syndrome: Secondary | ICD-10-CM | POA: Diagnosis not present

## 2018-10-25 DIAGNOSIS — G629 Polyneuropathy, unspecified: Secondary | ICD-10-CM | POA: Diagnosis not present

## 2018-11-09 DIAGNOSIS — E782 Mixed hyperlipidemia: Secondary | ICD-10-CM | POA: Diagnosis not present

## 2018-11-09 DIAGNOSIS — I10 Essential (primary) hypertension: Secondary | ICD-10-CM | POA: Diagnosis not present

## 2018-11-19 DIAGNOSIS — F411 Generalized anxiety disorder: Secondary | ICD-10-CM | POA: Diagnosis not present

## 2018-11-19 DIAGNOSIS — E782 Mixed hyperlipidemia: Secondary | ICD-10-CM | POA: Diagnosis not present

## 2018-11-19 DIAGNOSIS — I10 Essential (primary) hypertension: Secondary | ICD-10-CM | POA: Diagnosis not present

## 2018-11-19 DIAGNOSIS — Z862 Personal history of diseases of the blood and blood-forming organs and certain disorders involving the immune mechanism: Secondary | ICD-10-CM | POA: Diagnosis not present

## 2018-11-22 DIAGNOSIS — G629 Polyneuropathy, unspecified: Secondary | ICD-10-CM | POA: Diagnosis not present

## 2018-11-22 DIAGNOSIS — M545 Low back pain: Secondary | ICD-10-CM | POA: Diagnosis not present

## 2018-11-22 DIAGNOSIS — G894 Chronic pain syndrome: Secondary | ICD-10-CM | POA: Diagnosis not present

## 2018-11-22 DIAGNOSIS — F1120 Opioid dependence, uncomplicated: Secondary | ICD-10-CM | POA: Diagnosis not present

## 2018-11-22 DIAGNOSIS — Z79899 Other long term (current) drug therapy: Secondary | ICD-10-CM | POA: Diagnosis not present

## 2018-12-27 DIAGNOSIS — Z79899 Other long term (current) drug therapy: Secondary | ICD-10-CM | POA: Diagnosis not present

## 2018-12-27 DIAGNOSIS — G894 Chronic pain syndrome: Secondary | ICD-10-CM | POA: Diagnosis not present

## 2018-12-27 DIAGNOSIS — G629 Polyneuropathy, unspecified: Secondary | ICD-10-CM | POA: Diagnosis not present

## 2018-12-27 DIAGNOSIS — M545 Low back pain: Secondary | ICD-10-CM | POA: Diagnosis not present

## 2018-12-27 DIAGNOSIS — F1120 Opioid dependence, uncomplicated: Secondary | ICD-10-CM | POA: Diagnosis not present

## 2019-01-03 DIAGNOSIS — R635 Abnormal weight gain: Secondary | ICD-10-CM | POA: Diagnosis not present

## 2019-01-03 DIAGNOSIS — R5383 Other fatigue: Secondary | ICD-10-CM | POA: Diagnosis not present

## 2019-01-03 DIAGNOSIS — E291 Testicular hypofunction: Secondary | ICD-10-CM | POA: Diagnosis not present

## 2019-01-03 DIAGNOSIS — E782 Mixed hyperlipidemia: Secondary | ICD-10-CM | POA: Diagnosis not present

## 2019-01-10 DIAGNOSIS — R5383 Other fatigue: Secondary | ICD-10-CM | POA: Diagnosis not present

## 2019-01-10 DIAGNOSIS — Z6827 Body mass index (BMI) 27.0-27.9, adult: Secondary | ICD-10-CM | POA: Diagnosis not present

## 2019-01-10 DIAGNOSIS — E782 Mixed hyperlipidemia: Secondary | ICD-10-CM | POA: Diagnosis not present

## 2019-01-10 DIAGNOSIS — Z1331 Encounter for screening for depression: Secondary | ICD-10-CM | POA: Diagnosis not present

## 2019-01-10 DIAGNOSIS — Z1339 Encounter for screening examination for other mental health and behavioral disorders: Secondary | ICD-10-CM | POA: Diagnosis not present

## 2019-01-10 DIAGNOSIS — E291 Testicular hypofunction: Secondary | ICD-10-CM | POA: Diagnosis not present

## 2019-01-17 DIAGNOSIS — G629 Polyneuropathy, unspecified: Secondary | ICD-10-CM | POA: Diagnosis not present

## 2019-01-17 DIAGNOSIS — Z79899 Other long term (current) drug therapy: Secondary | ICD-10-CM | POA: Diagnosis not present

## 2019-01-17 DIAGNOSIS — G894 Chronic pain syndrome: Secondary | ICD-10-CM | POA: Diagnosis not present

## 2019-01-17 DIAGNOSIS — M545 Low back pain: Secondary | ICD-10-CM | POA: Diagnosis not present

## 2019-01-17 DIAGNOSIS — F1120 Opioid dependence, uncomplicated: Secondary | ICD-10-CM | POA: Diagnosis not present

## 2019-02-09 DIAGNOSIS — Z20828 Contact with and (suspected) exposure to other viral communicable diseases: Secondary | ICD-10-CM | POA: Diagnosis not present

## 2019-02-14 DIAGNOSIS — M545 Low back pain: Secondary | ICD-10-CM | POA: Diagnosis not present

## 2019-02-14 DIAGNOSIS — G629 Polyneuropathy, unspecified: Secondary | ICD-10-CM | POA: Diagnosis not present

## 2019-02-14 DIAGNOSIS — F1120 Opioid dependence, uncomplicated: Secondary | ICD-10-CM | POA: Diagnosis not present

## 2019-02-14 DIAGNOSIS — G894 Chronic pain syndrome: Secondary | ICD-10-CM | POA: Diagnosis not present

## 2019-02-16 DIAGNOSIS — Z03818 Encounter for observation for suspected exposure to other biological agents ruled out: Secondary | ICD-10-CM | POA: Diagnosis not present

## 2019-03-14 DIAGNOSIS — G629 Polyneuropathy, unspecified: Secondary | ICD-10-CM | POA: Diagnosis not present

## 2019-03-14 DIAGNOSIS — G894 Chronic pain syndrome: Secondary | ICD-10-CM | POA: Diagnosis not present

## 2019-03-14 DIAGNOSIS — Z79899 Other long term (current) drug therapy: Secondary | ICD-10-CM | POA: Diagnosis not present

## 2019-03-14 DIAGNOSIS — F1120 Opioid dependence, uncomplicated: Secondary | ICD-10-CM | POA: Diagnosis not present

## 2019-03-14 DIAGNOSIS — M545 Low back pain: Secondary | ICD-10-CM | POA: Diagnosis not present

## 2019-04-25 DIAGNOSIS — F1120 Opioid dependence, uncomplicated: Secondary | ICD-10-CM | POA: Diagnosis not present

## 2019-04-25 DIAGNOSIS — G629 Polyneuropathy, unspecified: Secondary | ICD-10-CM | POA: Diagnosis not present

## 2019-04-25 DIAGNOSIS — M545 Low back pain: Secondary | ICD-10-CM | POA: Diagnosis not present

## 2019-04-25 DIAGNOSIS — G894 Chronic pain syndrome: Secondary | ICD-10-CM | POA: Diagnosis not present

## 2019-05-23 DIAGNOSIS — F1120 Opioid dependence, uncomplicated: Secondary | ICD-10-CM | POA: Diagnosis not present

## 2019-05-23 DIAGNOSIS — Z79899 Other long term (current) drug therapy: Secondary | ICD-10-CM | POA: Diagnosis not present

## 2019-05-23 DIAGNOSIS — M545 Low back pain: Secondary | ICD-10-CM | POA: Diagnosis not present

## 2019-05-23 DIAGNOSIS — G629 Polyneuropathy, unspecified: Secondary | ICD-10-CM | POA: Diagnosis not present

## 2019-05-23 DIAGNOSIS — G894 Chronic pain syndrome: Secondary | ICD-10-CM | POA: Diagnosis not present

## 2019-06-20 DIAGNOSIS — M545 Low back pain: Secondary | ICD-10-CM | POA: Diagnosis not present

## 2019-06-20 DIAGNOSIS — F1120 Opioid dependence, uncomplicated: Secondary | ICD-10-CM | POA: Diagnosis not present

## 2019-06-20 DIAGNOSIS — G894 Chronic pain syndrome: Secondary | ICD-10-CM | POA: Diagnosis not present

## 2019-06-20 DIAGNOSIS — G629 Polyneuropathy, unspecified: Secondary | ICD-10-CM | POA: Diagnosis not present

## 2021-09-12 ENCOUNTER — Other Ambulatory Visit: Payer: Self-pay | Admitting: Orthopedic Surgery

## 2021-09-12 ENCOUNTER — Other Ambulatory Visit (HOSPITAL_COMMUNITY): Payer: Self-pay | Admitting: Orthopedic Surgery

## 2021-09-12 DIAGNOSIS — M25511 Pain in right shoulder: Secondary | ICD-10-CM

## 2021-09-30 ENCOUNTER — Ambulatory Visit
Admission: RE | Admit: 2021-09-30 | Discharge: 2021-09-30 | Disposition: A | Discharge status: Home / Self Care | Payer: BC Managed Care – PPO | Source: Ambulatory Visit | Attending: Orthopedic Surgery | Admitting: Orthopedic Surgery

## 2021-09-30 DIAGNOSIS — M25511 Pain in right shoulder: Secondary | ICD-10-CM | POA: Diagnosis not present

## 2021-09-30 DIAGNOSIS — M25512 Pain in left shoulder: Secondary | ICD-10-CM | POA: Insufficient documentation

## 2021-10-04 ENCOUNTER — Other Ambulatory Visit: Payer: Self-pay | Admitting: Orthopedic Surgery

## 2021-10-08 ENCOUNTER — Other Ambulatory Visit: Payer: Self-pay

## 2021-10-08 ENCOUNTER — Encounter: Payer: Self-pay | Admitting: Orthopedic Surgery

## 2021-10-18 ENCOUNTER — Encounter
Admission: RE | Disposition: A | Discharge status: Home / Self Care | Payer: Self-pay | Source: Home / Self Care | Attending: Orthopedic Surgery

## 2021-10-18 ENCOUNTER — Ambulatory Visit
Admission: RE | Admit: 2021-10-18 | Discharge: 2021-10-18 | Disposition: A | Discharge status: Home / Self Care | Payer: BC Managed Care – PPO | Attending: Orthopedic Surgery | Admitting: Orthopedic Surgery

## 2021-10-18 ENCOUNTER — Encounter: Payer: Self-pay | Admitting: Orthopedic Surgery

## 2021-10-18 ENCOUNTER — Other Ambulatory Visit: Payer: Self-pay

## 2021-10-18 ENCOUNTER — Ambulatory Visit: Payer: BC Managed Care – PPO | Admitting: Anesthesiology

## 2021-10-18 DIAGNOSIS — X58XXXA Exposure to other specified factors, initial encounter: Secondary | ICD-10-CM | POA: Diagnosis not present

## 2021-10-18 DIAGNOSIS — S46012A Strain of muscle(s) and tendon(s) of the rotator cuff of left shoulder, initial encounter: Secondary | ICD-10-CM | POA: Diagnosis present

## 2021-10-18 HISTORY — PX: SHOULDER ARTHROSCOPY WITH OPEN ROTATOR CUFF REPAIR: SHX6092

## 2021-10-18 SURGERY — ARTHROSCOPY, SHOULDER WITH REPAIR, ROTATOR CUFF, OPEN
Anesthesia: Regional | Site: Shoulder | Laterality: Left

## 2021-10-18 MED ORDER — BUPIVACAINE HCL (PF) 0.5 % IJ SOLN
INTRAMUSCULAR | Status: DC | PRN
  Administered 2021-10-18: 20 mL via PERINEURAL

## 2021-10-18 MED ORDER — LACTATED RINGERS IV SOLN
INTRAVENOUS | Status: DC | PRN
  Administered 2021-10-18: 4 mL

## 2021-10-18 MED ORDER — ACETAMINOPHEN 160 MG/5ML PO SOLN: 325.0000 mg | ORAL | Status: DC | PRN

## 2021-10-18 MED ORDER — LACTATED RINGERS IV SOLN: INTRAVENOUS | Status: DC

## 2021-10-18 MED ORDER — FENTANYL CITRATE (PF) 100 MCG/2ML IJ SOLN
INTRAMUSCULAR | Status: DC | PRN
  Administered 2021-10-18: 100 ug via INTRAVENOUS

## 2021-10-18 MED ORDER — CEFAZOLIN SODIUM-DEXTROSE 2-4 GM/100ML-% IV SOLN
2.0000 g | INTRAVENOUS | Status: AC
  Administered 2021-10-18: 2 g via INTRAVENOUS

## 2021-10-18 MED ORDER — GLYCOPYRROLATE 0.2 MG/ML IJ SOLN
INTRAMUSCULAR | Status: DC | PRN
  Administered 2021-10-18: .2 mg via INTRAVENOUS

## 2021-10-18 MED ORDER — LIDOCAINE HCL (CARDIAC) PF 100 MG/5ML IV SOSY
PREFILLED_SYRINGE | INTRAVENOUS | Status: DC | PRN
  Administered 2021-10-18: 30 mg via INTRATRACHEAL

## 2021-10-18 MED ORDER — ONDANSETRON HCL 4 MG/2ML IJ SOLN
INTRAMUSCULAR | Status: DC | PRN
  Administered 2021-10-18: 4 mg via INTRAVENOUS

## 2021-10-18 MED ORDER — ONDANSETRON HCL 4 MG/2ML IJ SOLN: 4.0000 mg | Freq: Once | INTRAMUSCULAR | Status: DC | PRN

## 2021-10-18 MED ORDER — ACETAMINOPHEN 500 MG PO TABS: 1000.0000 mg | ORAL_TABLET | Freq: Three times a day (TID) | ORAL | 2 refills | Status: DC

## 2021-10-18 MED ORDER — BUPIVACAINE LIPOSOME 1.3 % IJ SUSP
INTRAMUSCULAR | Status: DC | PRN
  Administered 2021-10-18: 20 mL via PERINEURAL

## 2021-10-18 MED ORDER — ASPIRIN EC 325 MG PO TBEC: 325.0000 mg | DELAYED_RELEASE_TABLET | Freq: Every day | ORAL | 0 refills | Status: AC

## 2021-10-18 MED ORDER — MIDAZOLAM HCL 5 MG/5ML IJ SOLN
INTRAMUSCULAR | Status: DC | PRN
  Administered 2021-10-18: 2 mg via INTRAVENOUS

## 2021-10-18 MED ORDER — ACETAMINOPHEN 325 MG PO TABS: 325.0000 mg | ORAL_TABLET | ORAL | Status: DC | PRN

## 2021-10-18 MED ORDER — OXYCODONE HCL 5 MG PO TABS: 5.0000 mg | ORAL_TABLET | ORAL | 0 refills | Status: DC | PRN

## 2021-10-18 MED ORDER — PROPOFOL 10 MG/ML IV BOLUS
INTRAVENOUS | Status: DC | PRN
  Administered 2021-10-18: 140 ug/kg/min via INTRAVENOUS

## 2021-10-18 MED ORDER — LACTATED RINGERS IR SOLN
Status: DC | PRN
  Administered 2021-10-18 (×2): 12000 mL

## 2021-10-18 MED ORDER — ONDANSETRON 4 MG PO TBDP: 4.0000 mg | ORAL_TABLET | Freq: Three times a day (TID) | ORAL | 0 refills | Status: DC | PRN

## 2021-10-18 SURGICAL SUPPLY — 62 items
ADAPTER IRRIG TUBE 2 SPIKE SOL (ADAPTER) ×4 IMPLANT
ADH SKN CLS APL DERMABOND .7 (GAUZE/BANDAGES/DRESSINGS) ×1
ADPR TBG 2 SPK PMP STRL ASCP (ADAPTER) ×2
ANCH SUT 2 SWLK 19.1 CLS EYLT (Anchor) ×3 IMPLANT
ANCHOR ICONIX SPEED 2.3 (Anchor) ×2 IMPLANT
ANCHOR SWIVELOCK BIO 4.75X19.1 (Anchor) ×3 IMPLANT
APL PRP STRL LF DISP 70% ISPRP (MISCELLANEOUS) ×1
BLADE SHAVER 4.5X7 STR FR (MISCELLANEOUS) ×2 IMPLANT
BUR BR 5.5 WIDE MOUTH (BURR) ×2 IMPLANT
CANNULA PART THRD DISP 5.75X7 (CANNULA) ×2 IMPLANT
CANNULA PARTIAL THREAD 2X7 (CANNULA) ×2 IMPLANT
CHLORAPREP W/TINT 26 (MISCELLANEOUS) ×2 IMPLANT
COOLER POLAR GLACIER W/PUMP (MISCELLANEOUS) ×2 IMPLANT
COVER LIGHT HANDLE UNIVERSAL (MISCELLANEOUS) ×4 IMPLANT
DERMABOND ADVANCED (GAUZE/BANDAGES/DRESSINGS) ×1
DERMABOND ADVANCED .7 DNX12 (GAUZE/BANDAGES/DRESSINGS) ×1 IMPLANT
DRAPE INCISE IOBAN 66X45 STRL (DRAPES) ×2 IMPLANT
DRAPE U-SHAPE 48X52 POLY STRL (PACKS) ×3 IMPLANT
DRSG TEGADERM 4X4.75 (GAUZE/BANDAGES/DRESSINGS) ×6 IMPLANT
ELECT REM PT RETURN 9FT ADLT (ELECTROSURGICAL) ×2
ELECTRODE REM PT RTRN 9FT ADLT (ELECTROSURGICAL) ×1 IMPLANT
GAUZE SPONGE 4X4 12PLY STRL (GAUZE/BANDAGES/DRESSINGS) ×2 IMPLANT
GAUZE XEROFORM 1X8 LF (GAUZE/BANDAGES/DRESSINGS) ×2 IMPLANT
GLOVE SRG 8 PF TXTR STRL LF DI (GLOVE) ×3 IMPLANT
GLOVE SURG ENC MOIS LTX SZ7.5 (GLOVE) ×5 IMPLANT
GLOVE SURG SS PI 7.5 STRL IVOR (GLOVE) ×3 IMPLANT
GLOVE SURG UNDER POLY LF SZ8 (GLOVE) ×2
GOWN STRL REIN 2XL XLG LVL4 (GOWN DISPOSABLE) ×2 IMPLANT
GOWN STRL REUS W/ TWL LRG LVL3 (GOWN DISPOSABLE) ×3 IMPLANT
GOWN STRL REUS W/TWL LRG LVL3 (GOWN DISPOSABLE) ×2
IV LACTATED RINGER IRRG 3000ML (IV SOLUTION) ×16
IV LR IRRIG 3000ML ARTHROMATIC (IV SOLUTION) ×6 IMPLANT
KIT STABILIZATION SHOULDER (MISCELLANEOUS) ×2 IMPLANT
KIT TURNOVER KIT A (KITS) ×2 IMPLANT
MANIFOLD 4PT FOR NEPTUNE1 (MISCELLANEOUS) ×2 IMPLANT
MASK FACE SPIDER DISP (MASK) ×2 IMPLANT
MAT ABSORB  FLUID 56X50 GRAY (MISCELLANEOUS) ×4
MAT ABSORB FLUID 56X50 GRAY (MISCELLANEOUS) ×2 IMPLANT
NDL MAYO CATGUT SZ4 (NEEDLE) ×2 IMPLANT
NDL MAYO CATGUT SZ4 TCR NDL (NEEDLE) IMPLANT
PACK ARTHROSCOPY SHOULDER (MISCELLANEOUS) ×2 IMPLANT
PAD ABD DERMACEA PRESS 5X9 (GAUZE/BANDAGES/DRESSINGS) ×4 IMPLANT
PAD WRAPON POLAR SHDR XLG (MISCELLANEOUS) ×1 IMPLANT
PASSER SUT FIRSTPASS SELF (INSTRUMENTS) ×1 IMPLANT
SPONGE T-LAP 18X18 ~~LOC~~+RFID (SPONGE) ×1 IMPLANT
SUT ETHILON 3-0 FS-10 30 BLK (SUTURE) ×2
SUT MNCRL 4-0 (SUTURE) ×2
SUT MNCRL 4-0 27XMFL (SUTURE) ×1
SUT PROLENE 0 CT 2 (SUTURE) ×1 IMPLANT
SUT VIC AB 0 CT1 36 (SUTURE) ×2 IMPLANT
SUT VIC AB 2-0 CT2 27 (SUTURE) ×2 IMPLANT
SUTURE EHLN 3-0 FS-10 30 BLK (SUTURE) IMPLANT
SUTURE MNCRL 4-0 27XMF (SUTURE) ×1 IMPLANT
SUTURE TAPE 1.3 40 TPR END (SUTURE) IMPLANT
SUTURETAPE 1.3 40 TPR END (SUTURE) ×2
SUTURETAPE 1.3 40 W/NDL BLK/WH (SUTURE) ×1 IMPLANT
SYSTEM FBRTK BICEPS 1.9 DRILL (Anchor) ×1 IMPLANT
TUBING CONNECTING 10 (TUBING) ×3 IMPLANT
TUBING INFLOW SET DBFLO PUMP (TUBING) ×2 IMPLANT
TUBING OUTFLOW SET DBLFO PUMP (TUBING) ×2 IMPLANT
WAND WEREWOLF FLOW 90D (MISCELLANEOUS) ×2 IMPLANT
WRAPON POLAR PAD SHDR XLG (MISCELLANEOUS) ×2

## 2021-10-18 NOTE — H&P (Signed)
Paper H&P to be scanned into permanent record. H&P reviewed. No significant changes noted.  

## 2021-10-18 NOTE — Discharge Instructions (Addendum)
Post-Op Instructions - Rotator Cuff Repair ? ?1. Bracing: You will wear a shoulder immobilizer or sling for 6 weeks.  ? ?2. Driving: No driving for 3 weeks post-op. When driving, do not wear the immobilizer. Ideally, we recommend no driving for 6 weeks while sling is in place as one arm will be immobilized.  ? ?3. Activity: No active lifting for 2 months. Wrist, hand, and elbow motion only. Avoid lifting the upper arm away from the body except for hygiene. You are permitted to bend and straighten the elbow passively only (no active elbow motion). You may use your hand and wrist for typing, writing, and managing utensils (cutting food). Do not lift more than a coffee cup for 8 weeks.  When sleeping or resting, inclined positions (recliner chair or wedge pillow) and a pillow under the forearm for support may provide better comfort for up to 4 weeks.  Avoid long distance travel for 4 weeks. ? ?Return to normal activities after rotator cuff repair repair normally takes 6 months on average. If rehab goes very well, may be able to do most activities at 4 months, except overhead or contact sports. ? ?4. Physical Therapy: Begins 3-4 days after surgery, and proceed 1 time per week for the first 6 weeks, then 1-2 times per week from weeks 6-20 post-op. ? ?5. Medications:  ?- You will be provided a prescription for narcotic pain medicine. After surgery, take 1-2 narcotic tablets every 4 hours if needed for severe pain.  ?- A prescription for anti-nausea medication will be provided in case the narcotic medicine causes nausea - take 1 tablet every 6 hours only if nauseated.   ?- Take tylenol 1000 mg (2 Extra Strength tablets or 3 regular strength) every 8 hours for pain.  May decrease or stop tylenol 5 days after surgery if you are having minimal pain. ?- Take ASA 325mg/day x 2 weeks to help prevent DVTs/PEs (blood clots).  ?- DO NOT take ANY nonsteroidal anti-inflammatory pain medications (Advil, Motrin, Ibuprofen, Aleve,  Naproxen, or Naprosyn). These medicines can inhibit healing of your shoulder repair.  ? ? ?If you are taking prescription medication for anxiety, depression, insomnia, muscle spasm, chronic pain, or for attention deficit disorder, you are advised that you are at a higher risk of adverse effects with use of narcotics post-op, including narcotic addiction/dependence, depressed breathing, death. ?If you use non-prescribed substances: alcohol, marijuana, cocaine, heroin, methamphetamines, etc., you are at a higher risk of adverse effects with use of narcotics post-op, including narcotic addiction/dependence, depressed breathing, death. ?You are advised that taking > 50 morphine milligram equivalents (MME) of narcotic pain medication per day results in twice the risk of overdose or death. For your prescription provided: oxycodone 5 mg - taking more than 6 tablets per day would result in > 50 morphine milligram equivalents (MME) of narcotic pain medication. ?Be advised that we will prescribe narcotics short-term, for acute post-operative pain only - 3 weeks for major operations such as shoulder repair/reconstruction surgeries.  ? ? ? ?6. Post-Op Appointment: ? ?Your first post-op appointment will be 10-14 days post-op. ? ?7. Work or School: For most, but not all procedures, we advise staying out of work or school for at least 1 to 2 weeks in order to recover from the stress of surgery and to allow time for healing.  ? ?If you need a work or school note this can be provided.  ? ?8. Smoking: If you are a smoker, you need to refrain from   smoking in the postoperative period. The nicotine in cigarettes will inhibit healing of your shoulder repair and decrease the chance of successful repair. Similarly, nicotine containing products (gum, patches) should be avoided.  ? ?Post-operative Brace: ?Apply and remove the brace you received as you were instructed to at the time of fitting and as described in detail as the brace?s  instructions for use indicate.  Wear the brace for the period of time prescribed by your physician.  The brace can be cleaned with soap and water and allowed to air dry only.  Should the brace result in increased pain, decreased feeling (numbness/tingling), increased swelling or an overall worsening of your medical condition, please contact your doctor immediately.  If an emergency situation occurs as a result of wearing the brace after normal business hours, please dial 911 and seek immediate medical attention.  Let your doctor know if you have any further questions about the brace issued to you. ?Refer to the shoulder sling instructions for use if you have any questions regarding the correct fit of your shoulder sling.  ?[facility] [employer]: [phone] ? ?Video that illustrates how to properly use a shoulder sling: ?"Instructions for Proper Use of an Orthopaedic Sling" ?[email or web address] ? ? ? ? ? ?Information for Discharge Teaching: ?EXPAREL (bupivacaine liposome injectable suspension)  ? ?Your surgeon or anesthesiologist gave you EXPAREL(bupivacaine) to help control your pain after surgery.  ?EXPAREL is a local anesthetic that provides pain relief by numbing the tissue around the surgical site. ?EXPAREL is designed to release pain medication over time and can control pain for up to 72 hours. ?Depending on how you respond to EXPAREL, you may require less pain medication during your recovery. ? ?Possible side effects: ?Temporary loss of sensation or ability to move in the area where bupivacaine was injected. ?Nausea, vomiting, constipation ?Rarely, numbness and tingling in your mouth or lips, lightheadedness, or anxiety may occur. ?Call your doctor right away if you think you may be experiencing any of these sensations, or if you have other questions regarding possible side effects. ? ?Follow all other discharge instructions given to you by your surgeon or nurse. Eat  a healthy diet and drink plenty of water or other fluids. ? ?If you return to the hospital for any reason within 96 hours following the administration of EXPAREL, it is important for health care providers to know that you have received this anesthetic. A teal colored band has been placed on your arm with the date, time and amount of EXPAREL you have received in order to alert and inform your health care providers. Please leave this armband in place for the full 96 hours following administration, and then you may remove the band. ? ?PERIPHERAL NERVE BLOCK PATIENT INFORMATION ? ?Your surgeon has requested a peripheral nerve block for your surgery. This anesthetic technique provides excellent post-operative pain relief for you in a safe and effective manner. It will also help reduce the risk of nausea and vomiting and allow earlier discharge from the hospital.   ?The block is performed under sedation with ultrasound guidance prior to your procedure. Due to the sedation, your may or may not remember the block experience. The nerve block will begin to take effect anywhere from 5 to 30 minutes after being administered. You will be transported to the operating room from your surgery after the block is completed.   ?At the end of surgery, when the anesthesia wears off, you will notice a few things. Your may not  be able to move or feel the part of your body targeted by the nerve block. These are normal experiences, and they will disappear as the block wears off.  ?If you had an interscalene nerve block performed (which is common for shoulder surgery), your voice can be very hoarse and you may feel that you are not able to take as deep a breath as you did before surgery. Some patients may also notice a droopy eyelid on the affected side. These symptoms will resolve once the block wears off.  ?Pain control: ?The nerve block technique used is a single injection that can last anywhere from 1-3 days. The duration of the numbness  can vary between individuals. After leaving the hospital, it is important that you begin to take your prescribed pain medication when you start to sense the nerve block wearing off. This will help you avoid unple

## 2021-10-18 NOTE — Op Note (Addendum)
SURGERY DATE: 10/18/2021 ? ?PRE-OP DIAGNOSIS:  ?1. Left biceps tendinopathy ?2. Left rotator cuff tear ? ?POST-OP DIAGNOSIS: ?Left biceps tendinopathy ?2.   Left rotator cuff tear (subscapularis & supraspinatus) ? ?PROCEDURES:  ?1. Left arthroscopic rotator cuff repair (subscapularis) ?2. Left mini-open rotator cuff repair (supraspinatus, anterior infraspinatus) ?3. Left open biceps tenodesis ?4. Left arthroscopic extensive debridement of shoulder (glenohumeral and subacromial spaces) ? ?SURGEON: Cato Mulligan, MD ? ?ANESTHESIA: MAC with Exparil interscalene block ? ?ESTIMATED BLOOD LOSS: 50cc ? ?DRAINS:  none ? ?TOTAL IV FLUIDS: per anesthesia  ? ?SPECIMENS: none ? ?IMPLANTS:  ?- Arthrex 4.566m SwiveLock x 3 ?- Arthrex 1.819mFiberTak ?- Iconix SPEED double loaded with 1.2 and 2.66m12mape x 2 ? ? ?OPERATIVE FINDINGS:  ?Examination under anesthesia: A careful examination under anesthesia was performed.  Passive range of motion was: FF: 150; ER at side: 60; ER in abduction: 90; IR in abduction: 40.  Anterior load shift: NT.  Posterior load shift: NT.  Sulcus in neutral: NT.  Sulcus in ER: NT.   ? ?Intra-operative findings: A thorough arthroscopic examination of the shoulder was performed.  The findings are: ?1. Biceps tendon: Complete tear of the proximal biceps tendon with segment attached to the biceps anchor complex severely frayed.  Distal portion scarred to surrounding soft tissue within the bicipital groove ?2. Superior labrum: injected with surrounding synovitis, type II SLAP tear ?3. Posterior labrum and capsule: normal ?4. Inferior capsule and inferior recess: normal ?5. Glenoid cartilage surface: Multiple areas of grade 3-4 degenerative changes diffusely ?6. Supraspinatus attachment: High-grade articular sided tear involving the entire supraspinatus and anterior infraspinatus with approximately 80%.  Small region of supraspinatus on the bursal side with full-thickness tearing. ?7. Posterior rotator cuff  attachment: normal ?8. Humeral head articular cartilage: Multiple, small areas of grade 4 degenerative changes; otherwise surrounding cartilage normal ?9. Rotator interval: significant synovitis ?10: Subscapularis tendon: Full-thickness tear of the superior subscapularis with mild retraction ?11. Anterior labrum: degenerative ?12. IGHL: normal ? ?OPERATIVE REPORT:  ? ?Indications for procedure: MicBRYSEN SHANKMAN a [age]o. male with chronic L shoulder pain that had significantly worsened over the past 4-5 months.  He has failed non-operative management including activity modification, physical therapy, medical management and corticosteroid injection without adequate relief of symptoms. Clinical exam and MRI were suggestive of high-grade partial-thickness rotator cuff tear involving the supraspinatus and subscapularis and severe biceps tendinopathy. After discussion of risks, benefits, and alternatives to surgery, the patient elected to proceed.  ? ?Procedure in detail: ? ?I identified MicAVYAY COGER the pre-operative holding area.  I marked the operative shoulder with my initials. I reviewed the risks and benefits of the proposed surgical intervention, and the patient (and/or patient's guardian) wished to proceed.  Anesthesia was then performed with an Exparel interscalene block.  The patient was transferred to the operative suite and placed in the beach chair position.   ? ?SCDs were placed on the lower extremities. Appropriate IV antibiotics were administered prior to incision. The operative upper extremity was then prepped and draped in standard fashion. A time out was performed confirming the correct extremity, correct patient, and correct procedure.  ? ?I then created a standard posterior portal with an 11 blade. The glenohumeral joint was easily entered with a blunt trochar and the arthroscope introduced. The findings of diagnostic arthroscopy are described above. I debrided the remnant of the proximal  biceps tendon down to the biceps anchor complex on the superior labrum first to  allow for appropriate visualization.  I then debrided degenerative tissue including the synovitic tissue about the rotator interval and anterior and superior labrum. I then coagulated the inflamed synovium to obtain hemostasis and reduce the risk of post-operative swelling using an Arthrocare radiofrequency device.  ? ?Next, the arthroscope was then introduced into the subacromial space. A direct lateral portal was created with an 11-blade after spinal needle localization. An extensive subacromial bursectomy was performed using a combination of the shaver and Arthrocare wand.  There was a small region of full-thickness tearing of the supraspinatus on the bursal side.  Degenerative edges of the rotator cuff were debrided with an oscillating shaver. To allow for a better repair, the entire extent of the supraspinatus and anterior infraspinatus was debrided, converting the entirety of the partial-thickness tear to a full-thickness tear. ? ?The glenohumeral joint was reentered, and attention was turned to the arthroscopic subscapularis repair.  A superior anterolateral portal was made under needle localization.  A 7 mm cannula was placed.  The, comma tissue indicating the superolateral border of the subscapularis was identified readily.  The tip of the coracoid as well as the conjoined tendon and coracoacromial ligaments were visualized after debriding rotator interval tissue.  The subscapularis was noted to be appropriately mobile.  The lesser tuberosity footprint was prepared with a combination of electrocautery and burr. 2 suture tapes were passed in a mattress fashion.  All 4 strands of suture were then loaded onto a 4.75 mm SwiveLock anchor and placed into the prepared footprint with the arm in a neutral position.  The repair stitch from the knotless mechanism was also utilized to further reduce the superior border of the subscapularis.   This construct appropriately reduced the subscapularis tear.  The arm was then internally and externally rotated and the subscapularis was noted to move appropriately with rotation.  The remainder of the suture was then cut. ?  ?Next, attention was turned to the mini open portion of the procedure to allow for repair of the supraspinatus and anterior infraspinatus.  A longitudinal incision from the anterolateral acromion ~7cm in length was made overlying the raphe between the anterior and middle heads of the deltoid.  This incorporated the anterolateral portal.  The raphe was identified and it was incised. The subacromial space was identified. Any remaining bursa was excised. The rotator cuff tear was identified. It was an L-shaped tear with the long limb of the L anterior.  ? ?We then turned our attention to the biceps tenodesis. The arm was externally rotated.  The bicipital groove was identified.  A 15 blade was used to make a cut overlying the biceps tendon, and the tendon was removed using a right angle clamp.  There were significant adhesions to the bicipital groove that were removed.  The base of the bicipital groove was identified and cleared of soft tissue.  A FiberTak anchor was placed in the bicipital groove.  The biceps tendon was held at the appropriate amount of tension.  One set of sutures was passed through the biceps anchor with one limb passed in a simple fashion and the second limb passed in a simple plus locking stitch pattern.  This was repeated for the other set of sutures.  This construct allowed for shuttling the biceps tendon down to the bone.  The sutures were tied and cut.  The diseased portion of the proximal biceps was then excised. ? ?The arm was then internally rotated.  The rotator cuff footprint was cleared of  soft tissue. A rongeur was used to gently decorticate the rotator cuff footprint to allow for improved healing. Two Iconix SPEED anchors were placed just lateral to the  articular margin.  The rotator cuff was mobilized using key elevators both superior and inferior to the tear. The rotator cuff was able to be reduced to its footprint and then held in a reduced position with graspe

## 2021-10-18 NOTE — Anesthesia Procedure Notes (Signed)
Procedure Name: General with mask airway ?Date/Time: 10/18/2021 7:49 AM ?Performed by: Jinny Blossom, CRNA ?Pre-anesthesia Checklist: Patient identified, Emergency Drugs available, Suction available, Timeout performed and Patient being monitored ?Patient Re-evaluated:Patient Re-evaluated prior to induction ?Oxygen Delivery Method: Simple face mask ?Placement Confirmation: positive ETCO2 ? ? ? ? ?

## 2021-10-18 NOTE — Transfer of Care (Signed)
Immediate Anesthesia Transfer of Care Note ? ?Patient: Hector Gonzalez ? ?Procedure(s) Performed: Left shoulder arthroscopic subscapularis repair, mini open rotator cuff repair, open biceps tenodesis (Left: Shoulder) ? ?Patient Location: PACU ? ?Anesthesia Type: General, Regional ? ?Level of Consciousness: awake, alert  and patient cooperative ? ?Airway and Oxygen Therapy: Patient Spontanous Breathing and Patient connected to supplemental oxygen ? ?Post-op Assessment: Post-op Vital signs reviewed, Patient's Cardiovascular Status Stable, Respiratory Function Stable, Patent Airway and No signs of Nausea or vomiting ? ?Post-op Vital Signs: Reviewed and stable ? ?Complications: No notable events documented. ? ?

## 2021-10-18 NOTE — Anesthesia Preprocedure Evaluation (Addendum)
Anesthesia Evaluation  ?Patient identified by MRN, date of birth, ID band ?Patient awake ? ? ? ?Reviewed: ?Allergy & Precautions, NPO status  ? ?Airway ?Mallampati: II ? ?TM Distance: >3 FB ? ? ? ? Dental ?  ?Pulmonary ?neg pulmonary ROS,  ?  ?Pulmonary exam normal ? ? ? ? ? ? ? Cardiovascular ?Exercise Tolerance: Good ? ?Rhythm:Regular Rate:Normal ? ?HLD ?  ?Neuro/Psych ?PSYCHIATRIC DISORDERS Depression   ? GI/Hepatic ?  ?Endo/Other  ? ? Renal/GU ?  ? ?  ?Musculoskeletal ? ? Abdominal ?  ?Peds ? Hematology ?  ?Anesthesia Other Findings ? ? Reproductive/Obstetrics ? ?  ? ? ? ? ? ? ? ? ? ? ? ? ? ?  ?  ? ? ? ? ? ? ? ? ?Anesthesia Physical ?Anesthesia Plan ? ?ASA: 2 ? ?Anesthesia Plan: General and Regional  ? ?Post-op Pain Management: Regional block  ? ?Induction: Intravenous ? ?PONV Risk Score and Plan: 2 and Treatment may vary due to age or medical condition, Midazolam, Dexamethasone and Ondansetron ? ?Airway Management Planned: Natural Airway and Simple Face Mask ? ?Additional Equipment:  ? ?Intra-op Plan:  ? ?Post-operative Plan:  ? ?Informed Consent: I have reviewed the patients History and Physical, chart, labs and discussed the procedure including the risks, benefits and alternatives for the proposed anesthesia with the patient or authorized representative who has indicated his/her understanding and acceptance.  ? ? ? ?Dental advisory given ? ?Plan Discussed with: CRNA ? ?Anesthesia Plan Comments:   ? ? ? ? ? ?Anesthesia Quick Evaluation ? ?

## 2021-10-18 NOTE — Progress Notes (Signed)
Assisted Jola Babinski, ANMD with left, interscalene , ultrasound guided block. Side rails up, monitors on throughout procedure. See vital signs in flow sheet. Tolerated Procedure well. ?

## 2021-10-18 NOTE — Anesthesia Procedure Notes (Signed)
Anesthesia Regional Block: Interscalene brachial plexus block  ? ?Pre-Anesthetic Checklist: , timeout performed,  Correct Patient, Correct Site, Correct Laterality,  Correct Procedure, Correct Position, site marked,  Risks and benefits discussed,  Surgical consent,  Pre-op evaluation,  At surgeon's request and post-op pain management ? ?Laterality: Left ? ?Prep: chloraprep     ?  ?Needles:  ?Injection technique: Single-shot ? ?Needle Type: Stimiplex   ? ? ?Needle Length: 10cm  ?Needle Gauge: 21  ? ? ? ?Additional Needles: ? ? ?Procedures:,,,, ultrasound used (permanent image in chart),,    ?Narrative:  ?Start time: 10/18/2021 7:00 AM ?End time: 10/18/2021 7:04 AM ?Injection made incrementally with aspirations every 5 mL. ? ?Performed by: Personally  ?Anesthesiologist: Veda Canning, MD ? ?Additional Notes: ?Functioning IV was confirmed and monitors applied. Ultrasound guidance: relevant anatomy identified, needle position confirmed, local anesthetic spread visualized around nerve(s)., vascular puncture avoided.  Image printed for medical record.  Negative aspiration and no paresthesias; incremental administration of local anesthetic. The patient tolerated the procedure well. Vitals signs recorded in RN notes. ? ? ? ?

## 2021-10-18 NOTE — Anesthesia Postprocedure Evaluation (Signed)
Anesthesia Post Note ? ?Patient: Hector Gonzalez ? ?Procedure(s) Performed: Left shoulder arthroscopic subscapularis repair, mini open rotator cuff repair, open biceps tenodesis (Left: Shoulder) ? ? ?  ?Patient location during evaluation: PACU ?Anesthesia Type: Regional ?Level of consciousness: awake ?Pain management: pain level controlled ?Vital Signs Assessment: post-procedure vital signs reviewed and stable ?Respiratory status: respiratory function stable ?Cardiovascular status: stable ?Postop Assessment: no signs of nausea or vomiting ?Anesthetic complications: no ? ? ?No notable events documented. ? ?Jola Babinski ? ? ? ? ? ?

## 2021-10-21 ENCOUNTER — Encounter: Payer: Self-pay | Admitting: Orthopedic Surgery

## 2021-12-17 ENCOUNTER — Other Ambulatory Visit: Payer: Self-pay | Admitting: Orthopedic Surgery

## 2021-12-17 DIAGNOSIS — M4807 Spinal stenosis, lumbosacral region: Secondary | ICD-10-CM

## 2021-12-30 ENCOUNTER — Ambulatory Visit
Admission: RE | Admit: 2021-12-30 | Discharge: 2021-12-30 | Disposition: A | Discharge status: Home / Self Care | Payer: BC Managed Care – PPO | Source: Ambulatory Visit | Attending: Orthopedic Surgery | Admitting: Orthopedic Surgery

## 2021-12-30 DIAGNOSIS — M4807 Spinal stenosis, lumbosacral region: Secondary | ICD-10-CM | POA: Diagnosis present

## 2022-01-07 ENCOUNTER — Other Ambulatory Visit: Payer: Self-pay | Admitting: Orthopedic Surgery

## 2022-01-07 DIAGNOSIS — G8929 Other chronic pain: Secondary | ICD-10-CM

## 2022-01-21 ENCOUNTER — Encounter: Payer: Self-pay | Admitting: Student in an Organized Health Care Education/Training Program

## 2022-01-22 ENCOUNTER — Ambulatory Visit: Payer: BC Managed Care – PPO | Admitting: Student in an Organized Health Care Education/Training Program

## 2022-02-04 ENCOUNTER — Other Ambulatory Visit: Payer: Self-pay | Admitting: Orthopedic Surgery

## 2022-02-04 DIAGNOSIS — M542 Cervicalgia: Secondary | ICD-10-CM

## 2022-02-12 ENCOUNTER — Ambulatory Visit
Admission: RE | Admit: 2022-02-12 | Discharge: 2022-02-12 | Disposition: A | Discharge status: Home / Self Care | Payer: BC Managed Care – PPO | Source: Ambulatory Visit | Attending: Orthopedic Surgery | Admitting: Orthopedic Surgery

## 2022-02-12 DIAGNOSIS — M542 Cervicalgia: Secondary | ICD-10-CM | POA: Insufficient documentation

## 2022-02-21 NOTE — Progress Notes (Unsigned)
Referring Physician:  Signa Kell, MD [address] [place],  [place] [address]  Primary Physician:  Hector Ivan, MD  History of Present Illness: 02/25/2022 [name] is here today with a chief complaint of bilateral shoulder pain that radiates into the bilateral arms.   He has been having pain since January 2023.  He typically noticed it when he began running for exercise and began having pain at the same time.  He was evaluated and found to have a complete rotator cuff tear on the left side which was repaired earlier this year.  This did help some of his shoulder pain, but he continues to have pain into his left shoulder blade and into his forearm.  His pain can be as bad as 8 out of 10 but is currently 3 out of 10 after he started Celebrex.  Lifting and using his arms make it worse. He does have some numbness in his right pinky.  Bowel/Bladder Dysfunction: none  Conservative measures:  Physical therapy: has not participated in for his neck Multimodal medical therapy including regular antiinflammatories:  tylenol, celebrex, flexeril, gabapentin, hydrocodone Injections: has not received any epidural steroid injections  Past Surgery: denies  Hector Gonzalez has no symptoms of cervical myelopathy.  The symptoms are causing a significant impact on the patient's life.   Review of Systems:  A 10 point review of systems is negative, except for the pertinent positives and negatives detailed in the HPI.  Past Medical History: Past Medical History:  Diagnosis Date   Hyperlipidemia    Nerve pain     Past Surgical History: Past Surgical History:  Procedure Laterality Date   ANKLE SURGERY     HERNIA REPAIR  2000   bilinguial hernia   KNEE SURGERY Right    SHOULDER ARTHROSCOPY WITH OPEN ROTATOR CUFF REPAIR Left 10/18/2021   Procedure: Left shoulder arthroscopic subscapularis repair, mini open rotator cuff repair, open biceps tenodesis;  Surgeon: Hector Kell,  MD;  Location: [facility];  Service: Orthopedics;  Laterality: Left;   TOTAL KNEE ARTHROPLASTY Right 01/18/2018   Procedure: RIGHT TOTAL KNEE ARTHROPLASTY;  Surgeon: Hector Huh, MD;  Location: [facility] OR;  Service: Orthopedics;  Laterality: Right;    Allergies: Allergies as of 02/25/2022   (No Known Allergies)    Medications: Current Meds  Medication Sig   atorvastatin (LIPITOR) 20 MG tablet Take 20 mg by mouth daily.   busPIRone (BUSPAR) 5 MG tablet Take 5 mg by mouth daily.   celecoxib (CELEBREX) 200 MG capsule Take 200 mg by mouth in the morning and at bedtime.   cholecalciferol (VITAMIN D3) 25 MCG (1000 UNIT) tablet Take 1,000 Units by mouth daily.   DULoxetine (CYMBALTA) 30 MG capsule Take 30 mg by mouth at bedtime.   EQUALACTIN 625 MG CHEW Chew 1 tablet by mouth at bedtime.   fenofibrate 160 MG tablet Take 160 mg by mouth daily.   gabapentin (NEURONTIN) 300 MG capsule Take 600 mg by mouth 3 (three) times daily.   Glucosamine-Chondroitin (COSAMIN DS PO) Take 1 tablet by mouth daily.   hydrochlorothiazide (HYDRODIURIL) 12.5 MG tablet Take 12.5 mg by mouth daily.   Multiple Vitamins-Minerals (ZINC PO) Take 1 tablet by mouth daily with lunch.   oxyCODONE ER (XTAMPZA ER) 9 MG C12A Take 9 mg by mouth in the morning and at bedtime.   vitamin B-12 (CYANOCOBALAMIN) 100 MCG tablet Take 100 mcg by mouth in the morning and at bedtime.   [DISCONTINUED] acetaminophen (  TYLENOL) 500 MG tablet Take 2 tablets (1,000 mg total) by mouth every 8 (eight) hours.    Social History: Social History   Tobacco Use   Smoking status: Never   Smokeless tobacco: Never  Vaping Use   Vaping Use: Never used  Substance Use Topics   Alcohol use: No   Drug use: No    Family Medical History: Family History  Problem Relation Age of Onset   Cancer Mother    Alzheimer's disease Father    Cancer Unknown    Diabetes Unknown    Mental illness Unknown     Physical Examination: Vitals:   02/25/22  0838  BP: (!) 140/84    General: Patient is well developed, well nourished, calm, collected, and in no apparent distress. Attention to examination is appropriate.  Neck:   Supple.  Full range of motion.  Respiratory: Patient is breathing without any difficulty.   NEUROLOGICAL:     Awake, alert, oriented to person, place, and time.  Speech is clear and fluent. Fund of knowledge is appropriate.   Cranial Nerves: Pupils equal round and reactive to light.  Facial tone is symmetric.  Facial sensation is symmetric. Shoulder shrug is symmetric. Tongue protrusion is midline.  There is no pronator drift.  ROM of spine: full.    Strength: Side Biceps Triceps Deltoid Interossei Grip Wrist Ext. Wrist Flex.  R 5 5 5 5 5 5 5   L 5 5 5 5 5 5 5    Side Iliopsoas Quads Hamstring PF DF EHL  R 5 5 5 5 5 5   L 5 5 5 5 5 5    Reflexes are 1+ and symmetric at the biceps, triceps, brachioradialis, patella and achilles.   Hoffman's is absent.  Clonus is not present.  Toes are down-going.  Bilateral upper and lower extremity sensation is intact to light touch.    No evidence of dysmetria noted.  Gait is normal.    Medical Decision Making  Imaging: MRI C spine 02/13/2022 IMPRESSION: 1. 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 At this level there is also mild spinal canal stenosis, moderate to severe left neural foraminal narrowing, and mild right neural foraminal narrowing. 2. C4-C5 mild-to-moderate spinal canal stenosis and mild left neural foraminal narrowing. 3. C5-C6 mild spinal canal stenosis with mild-to-moderate left and mild right neural foraminal narrowing. 4. C7-T1 moderate to severe right and mild left neural foraminal narrowing.     Electronically Signed   By: M.D.   On: 02/13/2022 14:15    I have personally reviewed the images and agree with the above  interpretation.  Assessment and Plan: Hector Gonzalez is a pleasant 56 y.o. male with cervical spondylosis causing cervical radiculopathy.  He does have an area of myelomalacia on his MRI scan, but I do not feel that this is causing any symptoms currently.  He has only mild central stenosis at this level.  He does have severe right C7-T1 and left C6-7 neuroforaminal stenosis which could be causing some of his symptoms.  At this point, he is doing somewhat better with Celebrex.  I think it is reasonable to try physical therapy and an injection.  I will help set this up for him and see him back in 6 to 8 weeks.   I spent a total of 30 minutes in face-to-face and non-face-to-face activities related to this patient's care today.  Thank you for involving me in the care of this patient.      Shanesha Bednarz K. Izora Ribas MD, [facility]

## 2022-02-25 ENCOUNTER — Ambulatory Visit (INDEPENDENT_AMBULATORY_CARE_PROVIDER_SITE_OTHER): Payer: BC Managed Care – PPO | Admitting: Neurosurgery

## 2022-02-25 ENCOUNTER — Encounter: Payer: Self-pay | Admitting: Neurosurgery

## 2022-02-25 VITALS — BP 140/84 | Ht 66.0 in | Wt 167.2 lb

## 2022-02-25 DIAGNOSIS — M5412 Radiculopathy, cervical region: Secondary | ICD-10-CM

## 2022-03-05 ENCOUNTER — Ambulatory Visit: Payer: BC Managed Care – PPO | Admitting: Student in an Organized Health Care Education/Training Program

## 2022-03-12 ENCOUNTER — Ambulatory Visit: Payer: BC Managed Care – PPO | Admitting: Student in an Organized Health Care Education/Training Program

## 2022-03-19 ENCOUNTER — Ambulatory Visit
Payer: BC Managed Care – PPO | Attending: Student in an Organized Health Care Education/Training Program | Admitting: Student in an Organized Health Care Education/Training Program

## 2022-03-19 ENCOUNTER — Encounter: Payer: Self-pay | Admitting: Student in an Organized Health Care Education/Training Program

## 2022-03-19 ENCOUNTER — Ambulatory Visit
Admission: RE | Admit: 2022-03-19 | Discharge: 2022-03-19 | Disposition: A | Discharge status: Home / Self Care | Payer: BC Managed Care – PPO | Source: Ambulatory Visit | Attending: Student in an Organized Health Care Education/Training Program | Admitting: Student in an Organized Health Care Education/Training Program

## 2022-03-19 VITALS — BP 112/78 | HR 61 | Temp 97.5°F | Resp 16 | Ht 66.0 in | Wt 165.0 lb

## 2022-03-19 DIAGNOSIS — M5412 Radiculopathy, cervical region: Secondary | ICD-10-CM | POA: Insufficient documentation

## 2022-03-19 DIAGNOSIS — M4802 Spinal stenosis, cervical region: Secondary | ICD-10-CM | POA: Insufficient documentation

## 2022-03-19 DIAGNOSIS — M5020 Other cervical disc displacement, unspecified cervical region: Secondary | ICD-10-CM | POA: Diagnosis present

## 2022-03-19 MED ORDER — LIDOCAINE HCL 2 % IJ SOLN
20.0000 mL | Freq: Once | INTRAMUSCULAR | Status: AC
  Administered 2022-03-19: 100 mg
  Filled 2022-03-19: qty 40

## 2022-03-19 MED ORDER — LACTATED RINGERS IV SOLN: Freq: Once | INTRAVENOUS | Status: AC

## 2022-03-19 MED ORDER — MIDAZOLAM HCL 5 MG/5ML IJ SOLN
0.5000 mg | Freq: Once | INTRAMUSCULAR | Status: AC
  Administered 2022-03-19: 1.5 mg via INTRAVENOUS
  Filled 2022-03-19: qty 5

## 2022-03-19 MED ORDER — IOHEXOL 180 MG/ML  SOLN
10.0000 mL | Freq: Once | INTRAMUSCULAR | Status: AC
  Administered 2022-03-19: 10 mL via EPIDURAL
  Filled 2022-03-19: qty 20

## 2022-03-19 MED ORDER — SODIUM CHLORIDE 0.9% FLUSH
1.0000 mL | Freq: Once | INTRAVENOUS | Status: AC
  Administered 2022-03-19: 1 mL

## 2022-03-19 MED ORDER — DEXAMETHASONE SODIUM PHOSPHATE 10 MG/ML IJ SOLN
10.0000 mg | Freq: Once | INTRAMUSCULAR | Status: AC
  Administered 2022-03-19: 10 mg
  Filled 2022-03-19: qty 1

## 2022-03-19 MED ORDER — ROPIVACAINE HCL 2 MG/ML IJ SOLN
1.0000 mL | Freq: Once | INTRAMUSCULAR | Status: AC
  Administered 2022-03-19: 1 mL via EPIDURAL
  Filled 2022-03-19: qty 20

## 2022-03-19 NOTE — Progress Notes (Signed)
PROVIDER NOTE: Interpretation of information contained herein should be left to medically-trained personnel. Specific patient instructions are provided elsewhere under "Patient Instructions" section of medical record. This document was created in part using STT-dictation technology, any transcriptional errors that may result from this process are unintentional.  Patient: Hector Gonzalez Type: Established DOB: 12/13/1965 MRN: [id number] PCP: Dion Body, MD  Service: Procedure DOS: 03/19/2022 Setting: Ambulatory Location: Ambulatory outpatient facility Delivery: Face-to-face Provider: Gillis Santa, MD Specialty: Interventional Pain Management Specialty designation: 09 Location: Outpatient facility Ref. Prov.: Meade Maw, MD   Procedure [facility] Interventional Pain Management )   Type: Cervical Epidural Steroid injection (ESI) (Interlaminar) #1  Laterality: Midline  Level: C7-T1 Imaging: Fluoroscopy-assisted DOS: 03/19/2022  Performed by: Gillis Santa, MD Anesthesia: Local anesthesia (1-2% Lidocaine) Sedation: Minimal Sedation  MINIMAL- IV Versed            Purpose: Diagnostic/Therapeutic Indications: Cervicalgia, cervical radicular pain, degenerative disc disease, severe enough to impact quality of life or function.  Hector Gonzalez presents with a chief complaint of neck pain with radiation into bilateral shoulders as well as bilateral arms.  This is pretty equal in both of his upper extremities.  He is referred here for a cervical epidural steroid injection.  Cervical MRI: CLINICAL DATA:  Bilateral shoulder pain   EXAM: MRI CERVICAL SPINE WITHOUT CONTRAST   TECHNIQUE: Multiplanar, multisequence MR imaging of the cervical spine was performed. No intravenous contrast was administered.   COMPARISON:  None available, a prior MRI of the cervical spine from 09/07/2000 could not be retrieved.   FINDINGS: Alignment: Straightening of the normal cervical lordosis. No significant  listhesis.   Vertebrae: No acute fracture or suspicious osseous lesion.   Cord: Focal area of increased T2 signal in the left aspect of the spinal cord at C6-C7 (series 5, image 9 and series 6, images 22-24). Spinal cord is otherwise normal in signal. Normal spinal cord morphology with some flattening particularly at C6-C7.   Posterior Fossa, vertebral arteries, paraspinal tissues: Negative.   Disc levels:   C2-C3: No significant disc bulge. No spinal canal stenosis or neuroforaminal narrowing.   C3-C4: Mild disc bulge with left subarticular disc protrusion. Facet and uncovertebral hypertrophy. No spinal canal stenosis. Mild left neural foraminal narrowing.   C4-C5: Mild disc bulge. Facet and uncovertebral hypertrophy. Mild-to-moderate spinal canal stenosis. Mild left neural foraminal narrowing.   C5-C6: Mild disc bulge. Facet and uncovertebral hypertrophy. Mild spinal canal stenosis. Mild right and mild-to-moderate left neural foraminal narrowing.   C6-C7: Disc bulge with left subarticular disc protrusion. Facet and uncovertebral hypertrophy. Mild spinal canal stenosis. Moderate to severe left and mild right neural foraminal narrowing.   C7-T1: Mild disc bulge. Facet and uncovertebral hypertrophy. No spinal canal stenosis. Moderate to severe right and mild left neural foraminal narrowing.   IMPRESSION: 1. 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 At this level there is also mild spinal canal stenosis, moderate to severe left neural foraminal narrowing, and mild right neural foraminal narrowing. 2. C4-C5 mild-to-moderate spinal canal stenosis and mild left neural foraminal narrowing. 3. C5-C6 mild spinal canal stenosis with mild-to-moderate left and mild right neural foraminal narrowing. 4. C7-T1 moderate to severe right and mild left neural foraminal narrowing.      1.  Cervical radicular pain   2. Cervical disc herniation   3. Spinal stenosis in cervical region    NAS-11 score:  Pre-procedure: 3 /10   Post-procedure: 0-No pain/10      Pre-Procedure Preparation  Monitoring: As per clinic protocol. Respiration, ETCO2, SpO2, BP, heart rate and rhythm monitor placed and checked for adequate function  Risk Assessment: Vitals:  [id number] body mass index is 26.63 kg/m as calculated from the following:   Height as of this encounter: 5\' 6"  (1.676 m).   Weight as of this encounter: 165 lb (74.8 kg)., Rate:62ECG Heart Rate: 60, BP:(!) 137/91, Resp:16, Temp:(!) 97.3 F (36.3 C), SpO2:100 %  Allergies: He has No Known Allergies.  Precautions: None required  Blood-thinner(s): None at this time  Coagulopathies: Reviewed. None identified.   Active Infection(s): Reviewed. None identified. Hector Gonzalez is afebrile   Location setting: Procedure suite Position: Prone, on modified reverse trendelenburg to facilitate breathing, with head in head-cradle. Pillows positioned under chest (below chin-level) with cervical spine flexed. Safety Precautions: Patient was assessed for positional comfort and pressure points before starting the procedure. Prepping solution: DuraPrep (Iodine Povacrylex [0.7% available iodine] and Isopropyl Alcohol, 74% w/w) Prep Area: Entire  cervicothoracic region Approach: percutaneous, paramedial Intended target: Posterior cervical epidural space Materials: Tray: Epidural Needle(s): Epidural (Tuohy) Qty: 1 Length: (39mm) 3.5-inch Gauge: 22G   Meds ordered this encounter  Medications   iohexol (OMNIPAQUE) 180 MG/ML injection 10 mL    Must be Myelogram-compatible. If not available, you may substitute with a water-soluble, non-ionic, hypoallergenic, myelogram-compatible radiological contrast medium.   lidocaine (XYLOCAINE) 2 % (with pres) injection 400 mg   midazolam (VERSED) 5 MG/5ML injection 0.5-2 mg    Make sure Flumazenil is  available in the pyxis when using this medication. If oversedation occurs, administer 0.2 mg IV over 15 sec. If after 45 sec no response, administer 0.2 mg again over 1 min; may repeat at 1 min intervals; not to exceed 4 doses (1 mg)   lactated ringers infusion   sodium chloride flush (NS) 0.9 % injection 1 mL   ropivacaine (PF) 2 mg/mL (0.2%) (NAROPIN) injection 1 mL   dexamethasone (DECADRON) injection 10 mg    Orders Placed This Encounter  Procedures   DG PAIN CLINIC C-ARM 1-60 MIN NO REPORT    Intraoperative interpretation by procedural physician at [facility].    Standing Status:   Standing    Number of Occurrences:   1    Order Specific Question:   Reason for exam:    Answer:   Assistance in needle guidance and placement for procedures requiring needle placement in or near specific anatomical locations not easily accessible without such assistance.     Time-out: 0920 I initiated and conducted the "Time-out" before starting the procedure, as per protocol. The patient was asked to participate by confirming the accuracy of the "Time Out" information. Verification of the correct person, site, and procedure were performed and confirmed by me, the nursing staff, and the patient. "Time-out" conducted as per Joint Commission's Universal Protocol (UP.01.01.01). Procedure checklist: Completed   H&P (Pre-op  Assessment)  Mr. Bradwell is a 56 y.o. (year old), male patient, seen today for interventional treatment. 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 Mr. Wuertz has a current medication list which includes the following prescription(s): atorvastatin, buspirone, celecoxib, cholecalciferol, duloxetine, equalactin, fenofibrate, gabapentin, glucosamine-chondroitin, hydrochlorothiazide, multiple vitamins-minerals,  xtampza er, and vitamin b-12, and the following Facility-Administered Medications: lactated ringers. His primarily concern today  is the Shoulder Pain (bilateral)  He has No Known Allergies.   Last encounter: My last encounter with him was on 2018 Pain Assessment: Severity of Chronic pain is reported as a 3 /10. Location: Shoulder Right, Left/both arms. Onset: More than a month ago. Quality: Burning, Sharp. Timing: Constant. Modifying factor(s): non-use. Vitals:  height is 5\' 6"  (1.676 m) and weight is 165 lb (74.8 kg). His temperature is 97.5 F (36.4 C) (abnormal). His blood pressure is 112/78 and his pulse is 61. His respiration is 16 and oxygen saturation is 100%.   Reason for encounter: Interventional pain management therapy due pain of at least four (4) weeks in duration, with to failure to respond to and/or inability to tolerate more conservative care.   Site Confirmation: Mr. Struble was asked to confirm the procedure and laterality before marking the site.  Consent: Before the procedure and under the influence of no sedative(s), amnesic(s), or anxiolytics, the patient was informed of the treatment options, risks and possible complications. To fulfill our ethical and legal obligations, as recommended by the American Medical Association's Code of Ethics, I have informed the patient of my clinical impression; the nature and purpose of the treatment or procedure; the risks, benefits, and possible complications of the intervention; the alternatives, including doing nothing; the risk(s) and benefit(s) of the alternative treatment(s) or procedure(s); and the risk(s) and benefit(s) of doing nothing. The patient was provided information about the general risks and possible complications associated with the procedure. These may include, but are not limited to: failure to achieve desired goals, infection, bleeding, organ or nerve damage, allergic reactions, paralysis, and death. In addition, the patient  was informed of those risks and complications associated to Spine-related procedures, such as failure to decrease pain; infection (i.e.: Meningitis, epidural or intraspinal abscess); bleeding (i.e.: epidural hematoma, subarachnoid hemorrhage, or any other type of intraspinal or peri-dural bleeding); organ or nerve damage (i.e.: Any type of peripheral nerve, nerve root, or spinal cord injury) with subsequent damage to sensory, motor, and/or autonomic systems, resulting in permanent pain, numbness, and/or weakness of one or several areas of the body; allergic reactions; (i.e.: anaphylactic reaction); and/or death. Furthermore, the patient was informed of those risks and complications associated with the medications. These include, but are not limited to: allergic reactions (i.e.: anaphylactic or anaphylactoid reaction(s)); adrenal axis suppression; blood sugar elevation that in diabetics may result in ketoacidosis or comma; water retention that in patients with history of congestive heart failure may result in shortness of breath, pulmonary edema, and decompensation with resultant heart failure; weight gain; swelling or edema; medication-induced neural toxicity; particulate matter embolism and blood vessel occlusion with resultant organ, and/or nervous system infarction; and/or aseptic necrosis of one or more joints. Finally, the patient was informed that Medicine is not an exact science; therefore, there is also the possibility of unforeseen or unpredictable risks and/or possible complications that may result in a catastrophic outcome. The patient indicated having understood very clearly. We have given the patient no guarantees and we have made no promises. Enough time was given to the patient to ask questions, all of which were answered to the patient's satisfaction. Mr. Boissonneault has indicated that he wanted to continue with the procedure. Attestation: I, the ordering provider, attest that I have discussed with the  patient the benefits, risks, side-effects, alternatives, likelihood of achieving goals, and potential problems during recovery for the procedure that I have provided informed consent.  Date  Time: 03/19/2022  8:32 AM  Prophylactic antibiotics  Anti-infectives (From admission, onward)    None      Indication(s): None identified   Description of procedure   Start Time: 0920 hrs  Local Anesthesia: Once the patient was positioned, prepped, and time-out was completed. The target area was identified located. The skin was marked with an approved surgical skin marker. Once marked, the skin (epidermis, dermis, and hypodermis), and deeper tissues (fat, connective tissue and muscle) were infiltrated with a small amount of a short-acting local anesthetic, loaded on a 10cc syringe with a 25G, 1.5-in  Needle. An appropriate amount of time was allowed for local anesthetics to take effect before proceeding to the next step. Local Anesthetic: Lidocaine 1-2% The unused portion of the local anesthetic was discarded in the proper designated containers. Safety Precautions: Aspiration looking for blood return was conducted prior to all injections. At no point did I inject any substances, as a needle was being advanced. Before injecting, the patient was told to immediately notify me if he was experiencing any new onset of "ringing in the ears, or metallic taste in the mouth". No attempts were made at seeking any paresthesias. Safe injection practices and needle disposal techniques used. Medications properly checked for expiration dates. SDV (single dose vial) medications used. After the completion of the procedure, all disposable equipment used was discarded in the proper designated medical waste containers.  Technical description: Protocol guidelines were followed. Using fluoroscopic guidance, the epidural needle was introduced through the skin, ipsilateral to the reported pain, and advanced to the target area.  Posterior laminar os was contacted and the needle walked caudad, until the lamina was cleared. The ligamentum flavum was engaged and the epidural space identified using "loss-of-resistance technique" with 2-3 ml of PF-NaCl (0.9% NSS), in a 5cc dedicated LOR syringe. See "Imaging guidance" below for use of contrast details.  Injection: Once satisfactory needle placement was confirmed, I proceeded to inject the desired solution in slow, incremental fashion, intermittently assessing for discomfort or any signs of abnormal or undesired spread of substance. Once completed, the needle was removed and disposed of, as per hospital protocols.  3 cc solution made of 1cc of preservative-free saline, 1 cc of 0.2% ropivacaine, 1 cc of Decadron 10 mg/cc.    Vitals:   03/19/22 0915 03/19/22 0920 03/19/22 0923 03/19/22 0932  BP: (!) 140/90 (!) 145/95 120/86 112/78  Pulse: 61     Resp: 18 18 16 16   Temp:    (!) 97.5 F (36.4 C)  TempSrc:      SpO2: 98% 98% 100% 100%  Weight:      Height:        End Time: 0923 hrs  Once the entire procedure was completed, the treated area was cleaned, making sure to leave some of the prepping solution back to take advantage of its long term bactericidal properties.   Imaging guidance  Type of Imaging Technique: Fluoroscopy Guidance (Spinal) Indication(s): Assistance in needle guidance and placement for procedures requiring needle placement in or near specific anatomical locations not easily accessible without such assistance. Exposure Time: Please see nurses notes for exact fluoroscopy time. Contrast: Before injecting any contrast, we confirmed that the patient did not have an allergy to iodine, shellfish, or radiological contrast. Once satisfactory needle placement was completed, radiological contrast was injected under continuous fluoroscopic guidance. Injection of contrast accomplished without complications. See chart for type and volume of contrast used. Fluoroscopic  Guidance: I was personally present in the fluoroscopy suite, where the patient was  placed in position for the procedure, over the fluoroscopy-compatible table. Fluoroscopy was manipulated, using "Tunnel Vision Technique", to obtain the best possible view of the target area, on the affected side. Parallax error was corrected before commencing the procedure. A "direction-depth-direction" technique was used to introduce the needle under continuous pulsed fluoroscopic guidance. Once the target was reached, antero-posterior, oblique, and lateral fluoroscopic projection views were taken to confirm needle placement in all planes. Electronic images uploaded into EMR.  Interpretation: Successful epidural injection. Intraoperative imaging interpretation by performing Physician.    Post-op assessment  Post-procedure Vital Signs:  Pulse/HCG Rate: 61(!) 56 Temp: (!) 97.5 F (36.4 C) Resp: 16 BP: 112/78 SpO2: 100 %  EBL: None  Complications: No immediate post-treatment complications observed by team, or reported by patient.  Note: The patient tolerated the entire procedure well. A repeat set of vitals were taken after the procedure and the patient was kept under observation following institutional policy, for this type of procedure. Post-procedural neurological assessment was performed, showing return to baseline, prior to discharge. The patient was provided with post-procedure discharge instructions, including a section on how to identify potential problems. Should any problems arise concerning this procedure, the patient was given instructions to immediately contact us, at any time, without hesitation. In any case, we plan to contact the patient by telephone for a follow-up status report regarding this interventional procedure.  Comments:  No additional relevant information.   5 out of 5 strength bilateral upper extremity: Shoulder abduction, elbow flexion, elbow extension, thumb extension.   Plan of  care    Medications administered: We administered iohexol, lidocaine, midazolam, lactated ringers, sodium chloride flush, ropivacaine (PF) 2 mg/mL (0.2%), and dexamethasone.  Follow-up plan:   Return in about 5 weeks (around 04/23/2022) for Post Procedure Evaluation, virtual.     Recent Visits No visits were found meeting these conditions. Showing recent visits within past 90 days and meeting all other requirements Today's Visits Date Type Provider Dept  03/19/22 Procedure visit Edward Jolly, MD [facility]  Showing today's visits and meeting all other requirements Future Appointments Date Type Provider Dept  04/23/22 Appointment Edward Jolly, MD [facility]  Showing future appointments within next 90 days and meeting all other requirements   Disposition: Discharge home  Discharge (Date  Time): 03/19/2022;   hrs.   Primary Care Physician: Marisue Ivan, MD Location: [facility] Note by: Edward Jolly, MD Date: 03/19/2022; Time: 9:35 AM  DISCLAIMER: Medicine is not an exact science. It has no guarantees or warranties. The decision to proceed with this intervention was based on the information collected from the patient. Conclusions were drawn from the patient's questionnaire, interview, and examination. Because information was provided in large part by the patient, it cannot be guaranteed that it has not been purposely or unconsciously manipulated or altered. Every effort has been made to obtain as much accurate, relevant, available data as possible. Always take into account that the treatment will also be dependent on availability of resources and existing treatment guidelines, considered by other Pain Management Specialists as being common knowledge and practice, at the time of the intervention. It is also important to point out that variation in procedural techniques and pharmacological choices are the acceptable norm. For  Medico-Legal review purposes, the indications, contraindications, technique, and results of the these procedures should only be evaluated, judged and interpreted by a Board-Certified Interventional Pain Specialist with extensive familiarity and expertise in the same exact procedure and technique.

## 2022-03-19 NOTE — Patient Instructions (Signed)
Pain Management Discharge Instructions  General Discharge Instructions :  If you need to reach your doctor call: Monday-Friday 8:00 am - 4:00 pm at [phone] or toll free [phone].  After clinic hours [phone] to have operator reach doctor.  Bring all of your medication bottles to all your appointments in the pain clinic.  To cancel or reschedule your appointment with Pain Management please remember to call 24 hours in advance to avoid a fee.  Refer to the educational materials which you have been given on: General Risks, I had my Procedure. Discharge Instructions, Post Sedation.  Post Procedure Instructions:  The drugs you were given will stay in your system until tomorrow, so for the next 24 hours you should not drive, make any legal decisions or drink any alcoholic beverages.  You may eat anything you prefer, but it is better to start with liquids then soups and crackers, and gradually work up to solid foods.  Please notify your doctor immediately if you have any unusual bleeding, trouble breathing or pain that is not related to your normal pain.  Depending on the type of procedure that was done, some parts of your body may feel week and/or numb.  This usually clears up by tonight or the next day.  Walk with the use of an assistive device or accompanied by an adult for the 24 hours.  You may use ice on the affected area for the first 24 hours.  Put ice in a Ziploc bag and cover with a towel and place against area 15 minutes on 15 minutes off.  You may switch to heat after 24 hours.Epidural Steroid Injection Patient Information  Description: The epidural space surrounds the nerves as they exit the spinal cord.  In some patients, the nerves can be compressed and inflamed by a bulging disc or a tight spinal canal (spinal stenosis).  By injecting steroids into the epidural space, we can bring irritated nerves into direct contact with a potentially helpful medication.  These  steroids act directly on the irritated nerves and can reduce swelling and inflammation which often leads to decreased pain.  Epidural steroids may be injected anywhere along the spine and from the neck to the low back depending upon the location of your pain.   After numbing the skin with local anesthetic (like Novocaine), a small needle is passed into the epidural space slowly.  You may experience a sensation of pressure while this is being done.  The entire block usually last less than 10 minutes.  Conditions which may be treated by epidural steroids:  Low back and leg pain Neck and arm pain Spinal stenosis Post-laminectomy syndrome Herpes zoster (shingles) pain Pain from compression fractures  Preparation for the injection:  Do not eat any solid food or dairy products within 8 hours of your appointment.  You may drink clear liquids up to 3 hours before appointment.  Clear liquids include water, black coffee, juice or soda.  No milk or cream please. You may take your regular medication, including pain medications, with a sip of water before your appointment  Diabetics should hold regular insulin (if taken separately) and take 1/2 normal NPH dos the morning of the procedure.  Carry some sugar containing items with you to your appointment. A driver must accompany you and be prepared to drive you home after your procedure.  Bring all your current medications with your. An IV may be inserted and sedation may be given at the discretion of the physician.     A blood pressure cuff, EKG and other monitors will often be applied during the procedure.  Some patients may need to have extra oxygen administered for a short period. You will be asked to provide medical information, including your allergies, prior to the procedure.  We must know immediately if you are taking blood thinners (like Coumadin/Warfarin)  Or if you are allergic to IV iodine contrast (dye). We must know if you could possible be  pregnant.  Possible side-effects: Bleeding from needle site Infection (rare, may require surgery) Nerve injury (rare) Numbness & tingling (temporary) Difficulty urinating (rare, temporary) Spinal headache ( a headache worse with upright posture) Light -headedness (temporary) Pain at injection site (several days) Decreased blood pressure (temporary) Weakness in arm/leg (temporary) Pressure sensation in back/neck (temporary)  Call if you experience: Fever/chills associated with headache or increased back/neck pain. Headache worsened by an upright position. New onset weakness or numbness of an extremity below the injection site Hives or difficulty breathing (go to the emergency room) Inflammation or drainage at the infection site Severe back/neck pain Any new symptoms which are concerning to you  Please note:  Although the local anesthetic injected can often make your back or neck feel good for several hours after the injection, the pain will likely return.  It takes 3-7 days for steroids to work in the epidural space.  You may not notice any pain relief for at least that one week.  If effective, we will often do a series of three injections spaced 3-6 weeks apart to maximally decrease your pain.  After the initial series, we generally will wait several months before considering a repeat injection of the same type.  If you have any questions, please call [phone] [facility] 

## 2022-03-20 ENCOUNTER — Telehealth: Payer: Self-pay

## 2022-03-20 NOTE — Telephone Encounter (Signed)
Post procedure phone call. Patient states he is doing well.  

## 2022-04-08 ENCOUNTER — Ambulatory Visit: Payer: BC Managed Care – PPO | Admitting: Neurosurgery

## 2022-04-23 ENCOUNTER — Ambulatory Visit
Payer: BC Managed Care – PPO | Attending: Student in an Organized Health Care Education/Training Program | Admitting: Student in an Organized Health Care Education/Training Program

## 2022-04-23 DIAGNOSIS — M5412 Radiculopathy, cervical region: Secondary | ICD-10-CM

## 2022-04-23 NOTE — Progress Notes (Signed)
I attempted to call the patient however no response.  -Dr Genny Caulder  

## 2022-05-13 ENCOUNTER — Encounter: Payer: Self-pay | Admitting: Neurosurgery

## 2022-05-13 ENCOUNTER — Ambulatory Visit (INDEPENDENT_AMBULATORY_CARE_PROVIDER_SITE_OTHER): Payer: BC Managed Care – PPO | Admitting: Neurosurgery

## 2022-05-13 VITALS — BP 142/86 | HR 73 | Wt 165.4 lb

## 2022-05-13 DIAGNOSIS — M5412 Radiculopathy, cervical region: Secondary | ICD-10-CM

## 2022-05-13 MED ORDER — CELECOXIB 200 MG PO CAPS: 200.0000 mg | ORAL_CAPSULE | Freq: Two times a day (BID) | ORAL | 0 refills | Status: DC

## 2022-05-13 NOTE — Progress Notes (Signed)
Referring Physician:  Meade Maw, [address],  [place] [address]  Primary Physician:  Dion Body, MD  History of Present Illness: 05/13/2022 Hector Gonzalez returns to see me.  He has had an injection which did not help.  He continues to have pain around both of his shoulders and upper arms.  He also has some discomfort in his forearms on both sides.  He has some tingling.  He has seen his shoulder specialist who feels he may be a candidate for a left-sided reverse total shoulder, but he is not quite ready for this.  02/25/2022 Hector Gonzalez is here today with a chief complaint of bilateral shoulder pain that radiates into the bilateral arms.   He has been having pain since January 2023.  He typically noticed it when he began running for exercise and began having pain at the same time.  He was evaluated and found to have a complete rotator cuff tear on the left side which was repaired earlier this year.  This did help some of his shoulder pain, but he continues to have pain into his left shoulder blade and into his forearm.  His pain can be as bad as 8 out of 10 but is currently 3 out of 10 after he started Celebrex.  Lifting and using his arms make it worse. He does have some numbness in his right pinky.  Bowel/Bladder Dysfunction: none  Conservative measures:  Physical therapy: has not participated in for his neck Multimodal medical therapy including regular antiinflammatories:  tylenol, celebrex, flexeril, gabapentin, hydrocodone Injections: has not received any epidural steroid injections  Past Surgery: denies  Hector Gonzalez has no symptoms of cervical myelopathy.  The symptoms are causing a significant impact on the patient's life.   Review of Systems:  A 10 point review of systems is negative, except for the pertinent positives and negatives detailed in the HPI.  Past Medical History: Past Medical History:  Diagnosis Date    Hyperlipidemia    Nerve pain     Past Surgical History: Past Surgical History:  Procedure Laterality Date   ANKLE SURGERY     HERNIA REPAIR  2000   bilinguial hernia   KNEE SURGERY Right    SHOULDER ARTHROSCOPY WITH OPEN ROTATOR CUFF REPAIR Left 10/18/2021   Procedure: Left shoulder arthroscopic subscapularis repair, mini open rotator cuff repair, open biceps tenodesis;  Surgeon: Leim Fabry, MD;  Location: [place] [facility];  Service: Orthopedics;  Laterality: Left;   TOTAL KNEE ARTHROPLASTY Right 01/18/2018   Procedure: RIGHT TOTAL KNEE ARTHROPLASTY;  Surgeon: Vickey Huger, MD;  Location: [place];  Service: Orthopedics;  Laterality: Right;    Allergies: Allergies as of 05/13/2022   (No Known Allergies)    Medications: No outpatient medications have been marked as taking for the 05/13/22 encounter (Office Visit) with Meade Maw, MD.    Social History: Social History   Tobacco Use   Smoking status: Never   Smokeless tobacco: Never  Vaping Use   Vaping Use: Never used  Substance Use Topics   Alcohol use: No   Drug use: No    Family Medical History: Family History  Problem Relation Age of Onset   Cancer Mother    Alzheimer's disease Father    Cancer Unknown    Diabetes Unknown    Mental illness Unknown     Physical Examination: Vitals:   05/13/22 1145  BP: (!) 142/86  Pulse: 73    General:  Patient is well developed, well nourished, calm, collected, and in no apparent distress. Attention to examination is appropriate.  Neck:   Supple.  Full range of motion.  Respiratory: Patient is breathing without any difficulty.   NEUROLOGICAL:     Awake, alert, oriented to person, place, and time.  Speech is clear and fluent. Fund of knowledge is appropriate.   Cranial Nerves: Pupils equal round and reactive to light.  Facial tone is symmetric.  Facial sensation is symmetric. Shoulder shrug is symmetric. Tongue protrusion is midline.  There is no pronator  drift.  ROM of spine: full.    Strength: Side Biceps Triceps Deltoid Interossei Grip Wrist Ext. Wrist Flex.  R 5 5 5 5 5 5 5   L 5 5 5 5 5 5 5    Side Iliopsoas Quads Hamstring PF DF EHL  R 5 5 5 5 5 5   L 5 5 5 5 5 5    Reflexes are 1+ and symmetric at the biceps, triceps, brachioradialis, patella and achilles.   Hoffman's is absent.  Clonus is not present.  Toes are down-going.  Bilateral upper and lower extremity sensation is intact to light touch.    No evidence of dysmetria noted.  Gait is normal.    Medical Decision Making  Imaging: MRI C spine 02/13/2022 IMPRESSION: 1. 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 At this level there is also mild spinal canal stenosis, moderate to severe left neural foraminal narrowing, and mild right neural foraminal narrowing. 2. C4-C5 mild-to-moderate spinal canal stenosis and mild left neural foraminal narrowing. 3. C5-C6 mild spinal canal stenosis with mild-to-moderate left and mild right neural foraminal narrowing. 4. C7-T1 moderate to severe right and mild left neural foraminal narrowing.     Electronically Signed   By: M.D.   On: 02/13/2022 14:15    I have personally reviewed the images and agree with the above interpretation.  Assessment and Plan: Hector Gonzalez is a pleasant 56 y.o. male with cervical spondylosis causing cervical radiculopathy.  He does have an area of myelomalacia on his MRI scan, but I do not feel that this is causing any symptoms currently.  He has only mild central stenosis at this level.  He does have severe right C7-T1 and left C6-7 neuroforaminal stenosis which could be causing some of his symptoms.  It is difficult to tell how much of this is from his neck and how much is from his shoulders.  I recommended physical therapy but he had some difficulty scheduling.  I will send him back for physical therapy.   I have refilled his Celebrex.  I will see him back after he is finished physical therapy.  He will look for a location near his work in [place].  I spent a total of 20 minutes in face-to-face and non-face-to-face activities related to this patient's care today.  Thank you for involving me in the care of this patient.      Hershell Brandl K. 02/15/2022 MD, [facility]

## 2022-05-27 ENCOUNTER — Ambulatory Visit: Payer: BC Managed Care – PPO | Admitting: Neurosurgery

## 2022-07-23 ENCOUNTER — Other Ambulatory Visit: Payer: Self-pay | Admitting: Neurosurgery

## 2022-07-29 ENCOUNTER — Other Ambulatory Visit: Payer: Self-pay | Admitting: Surgery

## 2022-07-30 ENCOUNTER — Other Ambulatory Visit: Payer: Self-pay

## 2022-07-30 ENCOUNTER — Encounter
Admission: RE | Admit: 2022-07-30 | Discharge: 2022-07-30 | Disposition: A | Discharge status: Home / Self Care | Payer: BC Managed Care – PPO | Source: Ambulatory Visit | Attending: Surgery | Admitting: Surgery

## 2022-07-30 HISTORY — DX: Anxiety disorder, unspecified: F41.9

## 2022-07-30 HISTORY — DX: Depression, unspecified: F32.A

## 2022-07-30 HISTORY — DX: Myoneural disorder, unspecified: G70.9

## 2022-07-30 NOTE — Patient Instructions (Addendum)
Your procedure is scheduled on: 08/07/22 - Thursday Report to the Registration Desk on the 1st floor of the [facility]. To find out your arrival time, please call [phone] between 1PM - 3PM on: 08/06/22 - Wednesday If your arrival time is 6:00 am, do not arrive before that time as the [facility] entrance doors do not open until 6:00 am.  REMEMBER: Instructions that are not followed completely may result in serious medical risk, up to and including death; or upon the discretion of your surgeon and anesthesiologist your surgery may need to be rescheduled.  Do not eat food after midnight the night before surgery.  No gum chewing or hard candies.  You may however, drink CLEAR liquids up to 2 hours before you are scheduled to arrive for your surgery. Do not drink anything within 2 hours of your scheduled arrival time.  Clear liquids include: - water  - apple juice without pulp - gatorade (not RED colors) - black coffee or tea (Do NOT add milk or creamers to the coffee or tea) Do NOT drink anything that is not on this list.  In addition, your doctor has ordered for you to drink the provided:  Ensure Pre-Surgery Clear Carbohydrate Drink  Drinking this carbohydrate drink up to two hours before surgery helps to reduce insulin resistance and improve patient outcomes. Please complete drinking 2 hours before scheduled arrival time.  One week prior to surgery: Stop Anti-inflammatories (NSAIDS) such as Advil, Aleve, Ibuprofen, Motrin, Naproxen, Naprosyn and Aspirin based products such as Excedrin, Goody's Powder, BC Powder.  Stop beginning 07/31/22, ANY OVER THE COUNTER supplements until after surgery.  - cholecalciferol (VITAMIN D3)  - Glucosamine-Chondroitin  - Multiple Vitamins-Minerals  - b complex vitamins    You may take Tylenol if needed for pain up until the day of surgery.  TAKE THESE MEDICATIONS THE MORNING OF SURGERY WITH A SIP OF WATER:  busPIRone (BUSPAR)  gabapentin  (NEURONTIN)  oxyCODONE ER  No Alcohol for 24 hours before or after surgery.  No Smoking including e-cigarettes for 24 hours before surgery.  No chewable tobacco products for at least 6 hours before surgery.  No nicotine patches on the day of surgery.  Do not use any "recreational" drugs for at least a week (preferably 2 weeks) before your surgery.  Please be advised that the combination of cocaine and anesthesia may have negative outcomes, up to and including death. If you test positive for cocaine, your surgery will be cancelled.  On the morning of surgery brush your teeth with toothpaste and water, you may rinse your mouth with mouthwash if you wish. Do not swallow any toothpaste or mouthwash.  Use CHG Soap or wipes as directed on instruction sheet.  Do not wear jewelry, make-up, hairpins, clips or nail polish.  Do not wear lotions, powders, or perfumes.   Do not shave body hair from the neck down 48 hours before surgery.  Contact lenses, hearing aids and dentures may not be worn into surgery.  Do not bring valuables to the hospital. [facility] is not responsible for any missing/lost belongings or valuables.   Notify your doctor if there is any change in your medical condition (cold, fever, infection).  Wear comfortable clothing (specific to your surgery type) to the hospital.  After surgery, you can help prevent lung complications by doing breathing exercises.  Take deep breaths and cough every 1-2 hours. Your doctor may order a device called an Incentive Spirometer to help you take deep  breaths. When coughing or sneezing, hold a pillow firmly against your incision with both hands. This is called "splinting." Doing this helps protect your incision. It also decreases belly discomfort.  If you are being admitted to the hospital overnight, leave your suitcase in the car. After surgery it may be brought to your room.  In case of increased patient census, it may be necessary for  you, the patient, to continue your postoperative care in the Same Day Surgery department.  If you are being discharged the day of surgery, you will not be allowed to drive home. You will need a responsible individual to drive you home and stay with you for 24 hours after surgery.   If you are taking public transportation, you will need to have a responsible individual with you.  Please call the [facility]. at [phone] if you have any questions about these instructions.  Surgery Visitation Policy:  Patients undergoing a surgery or procedure may have two family members or support persons with them as long as the person is not COVID-19 positive or experiencing its symptoms.   Inpatient Visitation:    Visiting hours are 7 a.m. to 8 p.m. Up to four visitors are allowed at one time in a patient room. The visitors may rotate out with other people during the day. One designated support person (adult) may remain overnight.  Due to an increase in RSV and influenza rates and associated hospitalizations, children ages 77 and under will not be able to visit patients in [facility].  Masks continue to be strongly recommended.    Preparing for Surgery with CHLORHEXIDINE GLUCONATE (CHG) Soap  Chlorhexidine Gluconate (CHG) Soap  o An antiseptic cleaner that kills germs and bonds with the skin to continue killing germs even after washing  o Used for showering the night before surgery and morning of surgery  Before surgery, you can play an important role by reducing the number of germs on your skin.  CHG (Chlorhexidine gluconate) soap is an antiseptic cleanser which kills germs and bonds with the skin to continue killing germs even after washing.  Please do not use if you have an allergy to CHG or antibacterial soaps. If your skin becomes reddened/irritated stop using the CHG.  1. Shower the NIGHT BEFORE SURGERY and the MORNING OF SURGERY with CHG soap.  2. If you  choose to wash your hair, wash your hair first as usual with your normal shampoo.  3. After shampooing, rinse your hair and body thoroughly to remove the shampoo.  4. Use CHG as you would any other liquid soap. You can apply CHG directly to the skin and wash gently with a scrungie or a clean washcloth.  5. Apply the CHG soap to your body only from the neck down. Do not use on open wounds or open sores. Avoid contact with your eyes, ears, mouth, and genitals (private parts). Wash face and genitals (private parts) with your normal soap.  6. Wash thoroughly, paying special attention to the area where your surgery will be performed.  7. Thoroughly rinse your body with warm water.  8. Do not shower/wash with your normal soap after using and rinsing off the CHG soap.  9. Pat yourself dry with a clean towel.  10. Wear clean pajamas to bed the night before surgery.  12. Place clean sheets on your bed the night of your first shower and do not sleep with pets.  13. Shower again with the CHG soap on  the day of surgery prior to arriving at the hospital.  14. Do not apply any deodorants/lotions/powders.  15. Please wear clean clothes to the hospital.

## 2022-08-06 MED ORDER — CEFAZOLIN SODIUM-DEXTROSE 2-4 GM/100ML-% IV SOLN
2.0000 g | INTRAVENOUS | Status: AC
  Administered 2022-08-07: 2 g via INTRAVENOUS

## 2022-08-06 MED ORDER — ORAL CARE MOUTH RINSE: 15.0000 mL | Freq: Once | OROMUCOSAL | Status: AC

## 2022-08-06 MED ORDER — CHLORHEXIDINE GLUCONATE 0.12 % MT SOLN: 15.0000 mL | Freq: Once | OROMUCOSAL | Status: AC

## 2022-08-06 MED ORDER — LACTATED RINGERS IV SOLN: INTRAVENOUS | Status: DC

## 2022-08-06 MED ORDER — FAMOTIDINE 20 MG PO TABS: 20.0000 mg | ORAL_TABLET | Freq: Once | ORAL | Status: AC

## 2022-08-07 ENCOUNTER — Encounter: Payer: Self-pay | Admitting: Surgery

## 2022-08-07 ENCOUNTER — Encounter
Admission: RE | Disposition: A | Discharge status: Home / Self Care | Payer: Self-pay | Source: Home / Self Care | Attending: Surgery

## 2022-08-07 ENCOUNTER — Other Ambulatory Visit: Payer: Self-pay

## 2022-08-07 ENCOUNTER — Ambulatory Visit
Admission: RE | Admit: 2022-08-07 | Discharge: 2022-08-07 | Disposition: A | Discharge status: Home / Self Care | Payer: BC Managed Care – PPO | Attending: Surgery | Admitting: Surgery

## 2022-08-07 ENCOUNTER — Ambulatory Visit: Payer: BC Managed Care – PPO | Admitting: Certified Registered"

## 2022-08-07 ENCOUNTER — Ambulatory Visit: Payer: BC Managed Care – PPO

## 2022-08-07 DIAGNOSIS — M75111 Incomplete rotator cuff tear or rupture of right shoulder, not specified as traumatic: Secondary | ICD-10-CM | POA: Insufficient documentation

## 2022-08-07 DIAGNOSIS — M19011 Primary osteoarthritis, right shoulder: Secondary | ICD-10-CM | POA: Diagnosis not present

## 2022-08-07 DIAGNOSIS — M75121 Complete rotator cuff tear or rupture of right shoulder, not specified as traumatic: Secondary | ICD-10-CM | POA: Insufficient documentation

## 2022-08-07 DIAGNOSIS — M25811 Other specified joint disorders, right shoulder: Secondary | ICD-10-CM | POA: Insufficient documentation

## 2022-08-07 DIAGNOSIS — M7541 Impingement syndrome of right shoulder: Secondary | ICD-10-CM | POA: Insufficient documentation

## 2022-08-07 DIAGNOSIS — E785 Hyperlipidemia, unspecified: Secondary | ICD-10-CM | POA: Insufficient documentation

## 2022-08-07 DIAGNOSIS — F32A Depression, unspecified: Secondary | ICD-10-CM | POA: Diagnosis not present

## 2022-08-07 DIAGNOSIS — F419 Anxiety disorder, unspecified: Secondary | ICD-10-CM | POA: Insufficient documentation

## 2022-08-07 DIAGNOSIS — W19XXXA Unspecified fall, initial encounter: Secondary | ICD-10-CM | POA: Insufficient documentation

## 2022-08-07 DIAGNOSIS — S43431A Superior glenoid labrum lesion of right shoulder, initial encounter: Secondary | ICD-10-CM | POA: Insufficient documentation

## 2022-08-07 HISTORY — PX: SHOULDER ARTHROSCOPY WITH SUBACROMIAL DECOMPRESSION, ROTATOR CUFF REPAIR AND BICEP TENDON REPAIR: SHX5687

## 2022-08-07 SURGERY — SHOULDER ARTHROSCOPY WITH SUBACROMIAL DECOMPRESSION, ROTATOR CUFF REPAIR AND BICEP TENDON REPAIR
Anesthesia: Regional | Site: Shoulder | Laterality: Right

## 2022-08-07 MED ORDER — FENTANYL CITRATE (PF) 100 MCG/2ML IJ SOLN: 25.0000 ug | INTRAMUSCULAR | Status: DC | PRN

## 2022-08-07 MED ORDER — LIDOCAINE HCL (PF) 2 % IJ SOLN
INTRAMUSCULAR | Status: AC
  Filled 2022-08-07: qty 5

## 2022-08-07 MED ORDER — LIDOCAINE HCL (PF) 1 % IJ SOLN
INTRAMUSCULAR | Status: DC | PRN
  Administered 2022-08-07: 4 mL via SUBCUTANEOUS

## 2022-08-07 MED ORDER — 0.9 % SODIUM CHLORIDE (POUR BTL) OPTIME
TOPICAL | Status: DC | PRN
  Administered 2022-08-07: 500 mL

## 2022-08-07 MED ORDER — DEXAMETHASONE SODIUM PHOSPHATE 10 MG/ML IJ SOLN
INTRAMUSCULAR | Status: DC | PRN
  Administered 2022-08-07: 10 mg via INTRAVENOUS

## 2022-08-07 MED ORDER — PROMETHAZINE HCL 25 MG/ML IJ SOLN: 6.2500 mg | INTRAMUSCULAR | Status: DC | PRN

## 2022-08-07 MED ORDER — BUPIVACAINE HCL (PF) 0.5 % IJ SOLN
INTRAMUSCULAR | Status: AC
  Filled 2022-08-07: qty 30

## 2022-08-07 MED ORDER — MIDAZOLAM HCL 2 MG/2ML IJ SOLN
INTRAMUSCULAR | Status: DC | PRN
  Administered 2022-08-07: 2 mg via INTRAVENOUS

## 2022-08-07 MED ORDER — LIDOCAINE HCL (PF) 1 % IJ SOLN
INTRAMUSCULAR | Status: AC
  Filled 2022-08-07: qty 5

## 2022-08-07 MED ORDER — DEXAMETHASONE SODIUM PHOSPHATE 10 MG/ML IJ SOLN
INTRAMUSCULAR | Status: AC
  Filled 2022-08-07: qty 1

## 2022-08-07 MED ORDER — EPHEDRINE SULFATE (PRESSORS) 50 MG/ML IJ SOLN
INTRAMUSCULAR | Status: DC | PRN
  Administered 2022-08-07: 7.5 mg via INTRAVENOUS
  Administered 2022-08-07: 5 mg via INTRAVENOUS

## 2022-08-07 MED ORDER — ACETAMINOPHEN 10 MG/ML IV SOLN
INTRAVENOUS | Status: AC
  Filled 2022-08-07: qty 100

## 2022-08-07 MED ORDER — PHENYLEPHRINE 80 MCG/ML (10ML) SYRINGE FOR IV PUSH (FOR BLOOD PRESSURE SUPPORT)
PREFILLED_SYRINGE | INTRAVENOUS | Status: AC
  Filled 2022-08-07: qty 10

## 2022-08-07 MED ORDER — ONDANSETRON HCL 4 MG/2ML IJ SOLN
INTRAMUSCULAR | Status: AC
  Filled 2022-08-07: qty 2

## 2022-08-07 MED ORDER — LACTATED RINGERS IV SOLN
INTRAVENOUS | Status: DC | PRN
  Administered 2022-08-07: 3001 mL

## 2022-08-07 MED ORDER — CHLORHEXIDINE GLUCONATE 0.12 % MT SOLN
OROMUCOSAL | Status: AC
  Administered 2022-08-07: 15 mL via OROMUCOSAL
  Filled 2022-08-07: qty 15

## 2022-08-07 MED ORDER — FENTANYL CITRATE (PF) 100 MCG/2ML IJ SOLN
INTRAMUSCULAR | Status: DC | PRN
  Administered 2022-08-07 (×2): 50 ug via INTRAVENOUS

## 2022-08-07 MED ORDER — PROPOFOL 10 MG/ML IV BOLUS
INTRAVENOUS | Status: AC
  Filled 2022-08-07: qty 40

## 2022-08-07 MED ORDER — FENTANYL CITRATE (PF) 100 MCG/2ML IJ SOLN
INTRAMUSCULAR | Status: AC
  Filled 2022-08-07: qty 2

## 2022-08-07 MED ORDER — PROPOFOL 10 MG/ML IV BOLUS
INTRAVENOUS | Status: DC | PRN
  Administered 2022-08-07: 150 mg via INTRAVENOUS
  Administered 2022-08-07: 100 mg via INTRAVENOUS

## 2022-08-07 MED ORDER — BUPIVACAINE LIPOSOME 1.3 % IJ SUSP
INTRAMUSCULAR | Status: DC | PRN
  Administered 2022-08-07: 20 mL via PERINEURAL

## 2022-08-07 MED ORDER — BUPIVACAINE LIPOSOME 1.3 % IJ SUSP
INTRAMUSCULAR | Status: AC
  Filled 2022-08-07: qty 20

## 2022-08-07 MED ORDER — MIDAZOLAM HCL 2 MG/2ML IJ SOLN
INTRAMUSCULAR | Status: AC
  Filled 2022-08-07: qty 2

## 2022-08-07 MED ORDER — ACETAMINOPHEN 10 MG/ML IV SOLN
INTRAVENOUS | Status: DC | PRN
  Administered 2022-08-07: 1000 mg via INTRAVENOUS

## 2022-08-07 MED ORDER — BUPIVACAINE HCL (PF) 0.5 % IJ SOLN
INTRAMUSCULAR | Status: DC | PRN
  Administered 2022-08-07: 10 mL via PERINEURAL

## 2022-08-07 MED ORDER — RINGERS IRRIGATION IR SOLN
Status: DC | PRN
  Administered 2022-08-07: 3000 mL

## 2022-08-07 MED ORDER — OXYCODONE HCL 5 MG PO TABS: 5.0000 mg | ORAL_TABLET | ORAL | 0 refills | Status: AC | PRN

## 2022-08-07 MED ORDER — BUPIVACAINE HCL (PF) 0.5 % IJ SOLN
INTRAMUSCULAR | Status: AC
  Filled 2022-08-07: qty 10

## 2022-08-07 MED ORDER — BUPIVACAINE-EPINEPHRINE 0.5% -1:200000 IJ SOLN
INTRAMUSCULAR | Status: DC | PRN
  Administered 2022-08-07: 30 mL

## 2022-08-07 MED ORDER — ONDANSETRON HCL 4 MG/2ML IJ SOLN
INTRAMUSCULAR | Status: DC | PRN
  Administered 2022-08-07: 4 mg via INTRAVENOUS

## 2022-08-07 MED ORDER — MIDAZOLAM HCL 2 MG/2ML IJ SOLN
INTRAMUSCULAR | Status: AC
  Administered 2022-08-07: 1 mg via INTRAVENOUS
  Filled 2022-08-07: qty 2

## 2022-08-07 MED ORDER — PHENYLEPHRINE HCL (PRESSORS) 10 MG/ML IV SOLN
INTRAVENOUS | Status: DC | PRN
  Administered 2022-08-07 (×3): 160 ug via INTRAVENOUS

## 2022-08-07 MED ORDER — ROCURONIUM BROMIDE 100 MG/10ML IV SOLN
INTRAVENOUS | Status: DC | PRN
  Administered 2022-08-07: 50 mg via INTRAVENOUS

## 2022-08-07 MED ORDER — FENTANYL CITRATE PF 50 MCG/ML IJ SOSY
PREFILLED_SYRINGE | INTRAMUSCULAR | Status: AC
  Administered 2022-08-07: 50 ug via INTRAVENOUS
  Filled 2022-08-07: qty 1

## 2022-08-07 MED ORDER — FENTANYL CITRATE PF 50 MCG/ML IJ SOSY: 50.0000 ug | PREFILLED_SYRINGE | Freq: Once | INTRAMUSCULAR | Status: AC

## 2022-08-07 MED ORDER — MIDAZOLAM HCL 2 MG/2ML IJ SOLN: 1.0000 mg | Freq: Once | INTRAMUSCULAR | Status: AC

## 2022-08-07 MED ORDER — ROCURONIUM BROMIDE 10 MG/ML (PF) SYRINGE
PREFILLED_SYRINGE | INTRAVENOUS | Status: AC
  Filled 2022-08-07: qty 10

## 2022-08-07 MED ORDER — EPINEPHRINE PF 1 MG/ML IJ SOLN
INTRAMUSCULAR | Status: AC
  Filled 2022-08-07: qty 2

## 2022-08-07 MED ORDER — CEFAZOLIN SODIUM-DEXTROSE 2-4 GM/100ML-% IV SOLN
INTRAVENOUS | Status: AC
  Filled 2022-08-07: qty 100

## 2022-08-07 MED ORDER — FAMOTIDINE 20 MG PO TABS
ORAL_TABLET | ORAL | Status: AC
  Administered 2022-08-07: 20 mg via ORAL
  Filled 2022-08-07: qty 1

## 2022-08-07 SURGICAL SUPPLY — 58 items
ANCH SUT 2 2.9 2 LD TPR NDL (Anchor) ×1 IMPLANT
ANCH SUT 2 2/0 ABS BRD STRL (Anchor) ×2 IMPLANT
ANCH SUT 5.5 KNTLS (Anchor) ×2 IMPLANT
ANCH SUT Q-FX 2.8 (Anchor) ×2 IMPLANT
ANCHOR ALL-SUT Q-FIX 2.8 (Anchor) IMPLANT
ANCHOR HEALICOIL REGEN 5.5 (Anchor) IMPLANT
ANCHOR JUGGERKNOT WTAP NDL 2.9 (Anchor) IMPLANT
ANCHOR SUT W/ ORTHOCORD (Anchor) IMPLANT
APL PRP STRL LF DISP 70% ISPRP (MISCELLANEOUS) ×2
BIT DRILL JUGRKNT W/NDL BIT2.9 (DRILL) IMPLANT
BLADE FULL RADIUS 3.5 (BLADE) ×1 IMPLANT
BUR ACROMIONIZER 4.0 (BURR) ×1 IMPLANT
CANNULA SHAVER 8MMX76MM (CANNULA) ×1 IMPLANT
CHLORAPREP W/TINT 26 (MISCELLANEOUS) ×1 IMPLANT
COVER MAYO STAND REUSABLE (DRAPES) ×1 IMPLANT
DILATOR 5.5 THREADED HEALICOIL (MISCELLANEOUS) IMPLANT
DRILL JUGGERKNOT W/NDL BIT 2.9 (DRILL) ×1
ELECT CAUTERY BLADE 6.4 (BLADE) ×1 IMPLANT
ELECT REM PT RETURN 9FT ADLT (ELECTROSURGICAL) ×1
ELECTRODE REM PT RTRN 9FT ADLT (ELECTROSURGICAL) ×1 IMPLANT
GAUZE SPONGE 4X4 12PLY STRL (GAUZE/BANDAGES/DRESSINGS) ×1 IMPLANT
GAUZE XEROFORM 1X8 LF (GAUZE/BANDAGES/DRESSINGS) ×1 IMPLANT
GLOVE BIO SURGEON STRL SZ7.5 (GLOVE) ×2 IMPLANT
GLOVE BIO SURGEON STRL SZ8 (GLOVE) ×2 IMPLANT
GLOVE BIOGEL PI IND STRL 8 (GLOVE) ×1 IMPLANT
GLOVE SURG UNDER LTX SZ8 (GLOVE) ×1 IMPLANT
GOWN STRL REUS W/ TWL LRG LVL3 (GOWN DISPOSABLE) ×1 IMPLANT
GOWN STRL REUS W/ TWL XL LVL3 (GOWN DISPOSABLE) ×1 IMPLANT
GOWN STRL REUS W/TWL LRG LVL3 (GOWN DISPOSABLE) ×1
GOWN STRL REUS W/TWL XL LVL3 (GOWN DISPOSABLE) ×1
GRASPER SUT 15 45D LOW PRO (SUTURE) IMPLANT
IV LACTATED RINGER IRRG 3000ML (IV SOLUTION) ×2
IV LR IRRIG 3000ML ARTHROMATIC (IV SOLUTION) ×2 IMPLANT
KIT CANNULA 8X76-LX IN CANNULA (CANNULA) ×1 IMPLANT
KIT SUTURE 2.8 Q-FIX DISP (MISCELLANEOUS) IMPLANT
MANIFOLD NEPTUNE II (INSTRUMENTS) ×2 IMPLANT
MASK FACE SPIDER DISP (MASK) ×1 IMPLANT
MAT ABSORB  FLUID 56X50 GRAY (MISCELLANEOUS) ×1
MAT ABSORB FLUID 56X50 GRAY (MISCELLANEOUS) ×1 IMPLANT
PACK ARTHROSCOPY SHOULDER (MISCELLANEOUS) ×1 IMPLANT
PAD ABD DERMACEA PRESS 5X9 (GAUZE/BANDAGES/DRESSINGS) ×2 IMPLANT
PASSER SUT FIRSTPASS SELF (INSTRUMENTS) IMPLANT
SLEEVE REMOTE CONTROL 5X12 (DRAPES) IMPLANT
SLING ARM LRG DEEP (SOFTGOODS) ×1 IMPLANT
SLING ULTRA II LG (MISCELLANEOUS) ×1 IMPLANT
SPONGE T-LAP 18X18 ~~LOC~~+RFID (SPONGE) ×1 IMPLANT
STAPLER SKIN PROX 35W (STAPLE) ×1 IMPLANT
STRAP SAFETY 5IN WIDE (MISCELLANEOUS) ×1 IMPLANT
SUT ETHIBOND 0 MO6 C/R (SUTURE) ×1 IMPLANT
SUT ULTRABRAID 2 COBRAID 38 (SUTURE) IMPLANT
SUT VIC AB 2-0 CT1 27 (SUTURE) ×2
SUT VIC AB 2-0 CT1 TAPERPNT 27 (SUTURE) ×2 IMPLANT
TAPE MICROFOAM 4IN (TAPE) ×1 IMPLANT
TRAP FLUID SMOKE EVACUATOR (MISCELLANEOUS) ×1 IMPLANT
TUBING CONNECTING 10 (TUBING) ×1 IMPLANT
TUBING INFLOW SET DBFLO PUMP (TUBING) ×1 IMPLANT
WAND WEREWOLF FLOW 90D (MISCELLANEOUS) ×1 IMPLANT
WATER STERILE IRR 500ML POUR (IV SOLUTION) ×1 IMPLANT

## 2022-08-07 NOTE — Transfer of Care (Signed)
Immediate Anesthesia Transfer of Care Note  Patient: Hector Gonzalez  Procedure(s) Performed: SHOULDER ARTHROSCOPY WITH DEBRIDEMENT, DECOMPRESSION, ROTATOR CUFF REPAIR AND BICEPS TENODESIS SLAP LESSION REPAIR (Right: Shoulder)  Patient Location: PACU  Anesthesia Type:General  Level of Consciousness: awake, alert , and oriented  Airway & Oxygen Therapy: Patient Spontanous Breathing and Patient connected to face mask oxygen  Post-op Assessment: Report given to RN and Post -op Vital signs reviewed and stable  Post vital signs: Reviewed and stable  Last Vitals:  Vitals Value Taken Time  BP    Temp    Pulse    Resp    SpO2      Last Pain:  Vitals:   08/07/22 0848  TempSrc: Temporal  PainSc: 6          Complications: No notable events documented.

## 2022-08-07 NOTE — Anesthesia Procedure Notes (Signed)
Anesthesia Regional Block: Interscalene brachial plexus block   Pre-Anesthetic Checklist: , timeout performed,  Correct Patient, Correct Site, Correct Laterality,  Correct Procedure, Correct Position, site marked,  Risks and benefits discussed,  Surgical consent,  Pre-op evaluation,  At surgeon's request and post-op pain management  Laterality: Right and Upper  Prep: chloraprep       Needles:  Injection technique: Single-shot  Needle Type: Stimiplex     Needle Length: 5cm  Needle Gauge: 22     Additional Needles:   Procedures:,,,, ultrasound used (permanent image in chart),,    Narrative:  Start time: 08/07/2022 9:44 AM End time: 08/07/2022 9:46 AM Injection made incrementally with aspirations every 5 mL.  Performed by: Personally  Anesthesiologist: Martha Clan, MD  Additional Notes: Functioning IV was confirmed and monitors were applied.  A 13m 22ga Stimuplex needle was used. Sterile prep and drape,hand hygiene and sterile gloves were used.  Negative aspiration and negative test dose prior to incremental administration of local anesthetic. The patient tolerated the procedure well.

## 2022-08-07 NOTE — Op Note (Signed)
08/07/2022  12:31 PM  Patient:   Hector Gonzalez  Pre-Op Diagnosis:   Impingement/tendinopathy with rotator cuff tear, right shoulder  Post-Op Diagnosis:   Impingement/tendinopathy with full-thickness supraspinatus tear, partial-thickness subscapularis tear, SLAP tear, degenerative joint disease, and biceps tendinopathy, right shoulder.  Procedure:   Extensive arthroscopic debridement, arthroscopic subscapularis tendon repair, arthroscopic SLAP repair, arthroscopic subacromial decompression, mini-open rotator cuff repair, and mini-open biceps tenodesis, right shoulder.  Anesthesia:   General endotracheal with interscalene block using Exparel placed preoperatively by the anesthesiologist.  Surgeon:   Pascal Lux, MD  Assistant:   Cameron Proud, PA-C  Findings:   As above.  There was significant labral fraying superiorly and posterior superiorly with an unstable Type II SLAP tear extending from the 10:30 to the 1 o'clock position.  There was a full-thickness tear involving the anterior and mid insertional fibers of the supraspinatus tendon, as well as an articular sided partial-thickness tear involving the superior insertional fibers of the subscapularis tendon.  The remainder of the rotator cuff was in satisfactory condition.  There were grade II-III chondromalacia changes involving both the glenoid and humeral head.  The biceps tendon demonstrated significant tendinopathy with partial-thickness tearing.  Complications:   None  Fluids:   750 cc  Estimated blood loss:   15 cc  Tourniquet time:   None  Drains:   None  Closure:   Staples      Brief clinical note:   The patient is a 57 year old male with a several year history of progressively worsening right shoulder pain. His symptoms were aggravated by a fall about 6 months ago. The patient's symptoms have progressed despite medications, activity modification, etc. The patient's history and examination are consistent with  impingement/tendinopathy with a rotator cuff tear. These findings were confirmed by MRI scan. The patient presents at this time for definitive management of these shoulder symptoms.  Procedure:   The patient underwent placement of an interscalene block using Exparel by the anesthesiologist in the preoperative holding area before being brought into the operating room and lain in the supine position. The patient then underwent general endotracheal intubation and anesthesia before being repositioned in the beach chair position using the beach chair positioner. The right shoulder and upper extremity were prepped with ChloraPrep solution before being draped sterilely. Preoperative antibiotics were administered. A timeout was performed to confirm the proper surgical site before the expected portal sites and incision site were injected with 0.5% Sensorcaine with epinephrine.   A posterior portal was created and the glenohumeral joint thoroughly inspected with the findings as described above. An anterior portal was created using an outside-in technique. The labrum and rotator cuff were further probed, again confirming the above-noted findings. Areas of degenerative labral fraying were debrided back to stable margins using the full-radius resector. The full-radius resector also was used to debride the torn portions of the supraspinatus and subscapularis tendons, as well as to debride areas of loose articular cartilage of both the humeral head and glenoid surfaces. Finally, it was used to debride areas of synovitis superiorly, anteriorly, and posteriorly. The ArthroCare wand was inserted and used to release the biceps tendon from its labral anchor.  It also was used to obtain hemostasis as well as to "anneal" the labrum superiorly and anteriorly.  Probing of the superior labrum demonstrated detachment of the labrum from the superior portion of the glenoid rim, resulting in inferior subluxation of the labrum which  appeared to be symptomatic. Therefore, the labrum was repaired using  a single Mitek BioKnotless anchor placed at the 12 o'clock position. A separate superolateral portal site was created using an outside in technique to serve as a working portal. The adequacy of the repair was verified by probing and found to be excellent.    Next, the subscapularis tear was repaired using a second Mitek BioKnotless anchor which was placed through the anterior portal. The same superolateral portal was used as a working portal to facilitate passage of the device. The adequacy of repair again was verified both by probing as well as with passive external rotation of the shoulder and found to be excellent. The instruments were removed from the joint after suctioning the excess fluid.  The camera was repositioned through the posterior portal into the subacromial space. A separate lateral portal was created using an outside-in technique. The 3.5 mm full-radius resector was introduced and used to perform a subtotal bursectomy. The ArthroCare wand was then inserted and used to remove the periosteal tissue off the undersurface of the anterior third of the acromion as well as to recess the coracoacromial ligament from its attachment along the anterior and lateral margins of the acromion. The 4.0 mm acromionizing bur was introduced and used to complete the decompression by removing the undersurface of the anterior third of the acromion. The full radius resector was reintroduced to remove any residual bony debris before the ArthroCare wand was reintroduced to obtain hemostasis. The instruments were then removed from the subacromial space after suctioning the excess fluid.  An approximately 4-5 cm incision was made over the anterolateral aspect of the shoulder beginning at the anterolateral corner of the acromion and extending distally in line with the bicipital groove. This incision was carried down through the subcutaneous tissues to  expose the deltoid fascia. The raphae between the anterior and middle thirds was identified and this plane developed to provide access into the subacromial space. Additional bursal tissues were debrided sharply using Metzenbaum scissors. The rotator cuff tear was readily identified. The margins were debrided sharply with a #15 blade and the exposed greater tuberosity roughened with a rongeur. The tear was repaired using two [employer] 2.9 mm Q-Fix anchors. These sutures were then brought back laterally and secured using two [place] knotless RegeneSorb anchors to create a two-layer closure. An apparent watertight closure was obtained.  The bicipital groove was identified by palpation and opened for 1-1.5 cm. The biceps tendon stump was retrieved through this defect. The floor of the bicipital groove was roughened with a curet before a Biomet 2.9 mm JuggerKnot anchor was inserted. Both sets of sutures were passed through the biceps tendon and tied securely to effect the tenodesis. The bicipital sheath was reapproximated using two #0 Ethibond interrupted sutures, incorporating the biceps tendon to further reinforce the tenodesis.  The wound was copiously irrigated with sterile saline solution before the deltoid raphae was reapproximated using 2-0 Vicryl interrupted sutures. The subcutaneous tissues were closed in two layers using 2-0 Vicryl interrupted sutures before the skin was closed using staples. The portal sites also were closed using staples. A sterile bulky dressing was applied to the shoulder before the arm was placed into a shoulder immobilizer. The patient was then awakened, extubated, and returned to the recovery room in satisfactory condition after tolerating the procedure well.

## 2022-08-07 NOTE — Discharge Instructions (Addendum)
Orthopedic discharge instructions: Keep dressing dry and intact.  May shower after dressing changed on post-op day #4 (Monday).  Cover staples with Band-Aids after drying off. Apply ice frequently to shoulder or use Polar Care device. Take Celebrex 200 mg BID OR ibuprofen 600-800 mg TID with meals for 3-5 days, then as necessary. Take oxycodone as prescribed when needed.  May supplement with ES Tylenol if necessary. Keep shoulder immobilizer on at all times except may remove for bathing purposes. Follow-up in 10-14 days or as scheduled.  DO NOT REMOVE GREEN TEAL ARMBAND FOR 4 DAYS   AMBULATORY SURGERY  DISCHARGE INSTRUCTIONS   The drugs that you were given will stay in your system until tomorrow so for the next 24 hours you should not:  Drive an automobile Make any legal decisions Drink any alcoholic beverage   You may resume regular meals tomorrow.  Today it is better to start with liquids and gradually work up to solid foods.  You may eat anything you prefer, but it is better to start with liquids, then soup and crackers, and gradually work up to solid foods.   Please notify your doctor immediately if you have any unusual bleeding, trouble breathing, redness and pain at the surgery site, drainage, fever, or pain not relieved by medication.    Additional Instructions:SHOULDER SLING IMMOBILIZER   VIDEO Slingshot 2 Shoulder Brace Application - YouTube ---[email or web address]  INSTRUCTIONS While supporting the injured arm, slide the forearm into the sling. Wrap the adjustable shoulder strap around the neck and shoulders and attach the strap end to the sling using  the "alligator strap tab."  Adjust the shoulder strap to the required length. Position the shoulder pad behind the neck. To secure the shoulder pad location (optional), pull the shoulder strap away from the shoulder pad, unfold the hook material on the top of the pad, then press the shoulder  strap back onto the hook material to secure the pad in place. Attach the closure strap across the open top of the sling. Position the strap so that it holds the arm securely in the sling. Next, attach the thumb strap to the open end of the sling between the thumb and fingers. After sling has been fit, it may be easily removed and reapplied using the quick release buckle on shoulder strap. If a neutral pillow or 15 abduction pillow is included, place the pillow at the waistline. Attach the sling to the pillow, lining up hook material on the pillow with the loop on sling. Adjust the waist strap to fit.  If waist strap is too long, cut it to fit. Use the small piece of double sided hook material (located on top of the pillow) to secure the strap end. Place the double sided hook material on the inside of the cut strap end and secure it to the waist strap.     If no pillow is included, attach the waist strap to the sling and adjust to fit.    Washing Instructions: Straps and sling must be removed and cleaned regularly depending on your activity level and perspiration. Hand wash straps and sling in cold water with mild detergent, rinse, air dry

## 2022-08-07 NOTE — H&P (Signed)
History of Present Illness:  Hector Gonzalez is a 57 y.o. male who presents today as a result of a call to our clinic for right shoulder pain.   The patient's symptoms began over a year ago and developed without any specific cause or injury . The patient was being followed by Dr. Posey Pronto for his left shoulder, which was giving him more difficulty at the time of presentation, so he underwent a left shoulder surgery for repair of a rotator cuff tear in April, 2023. 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 He presented to [facility] where he saw Dr. Mack Guise. The patient was sent for an MRI scan which confirmed the presence of a near full-thickness rotator cuff tear of the supraspinatus tendon, extending into the infraspinatus tendon, as well as labral pathology and biceps tendinopathy. It also demonstrated evidence of a partial-thickness tear of the latissimus dorsi tendon. According to the [facility] notes, he began to describe more pain in his left shoulder so he was worked up for the symptoms. A arthro-CT scan confirmed the presence of a recurrent type II supraspinatus tear of his left shoulder which was deemed not repairable. He was told that he would be a candidate for reverse shoulder replacement if and when he felt the symptoms became sufficiently severe.   Due to a neighbor's recommendation, the patient elected to see me for further evaluation and treatment of his right shoulder pain. The patient describes the symptoms as moderate (patient is active but has had to make modifications or give up activities) and have the quality of being aching, nagging, stabbing, tender, and throbbing. The pain is localized to the lateral arm/shoulder and localized to the posterior shoulder. These symptoms are aggravated with normal daily activities, carrying heavy objects, at higher levels of activity, with overhead activity, reaching behind the  back, and and with lifting any weight away from his body, especially above shoulder level . He has tried acetaminophen, non-steroidal anti-inflammatories (Celebrex ), narcotics , and muscle relaxants with temporary partial relief. He has tried physical therapy and rest with no significant benefit. He has tried several steroid injections which provided little if any relief of the symptoms. Apparently, the patient also underwent workup for a cervical etiology of his symptoms, including an MRI scan of his neck which showed multilevel cervical degenerative disc disease with foraminal stenosis. However, evaluation by a spine surgeon showed no evidence of contribution to his shoulder symptoms by evocative test of the neck so he was advised to return to orthopedics. He also underwent an EMG of both upper extremities which showed no evidence of the neck being the etiology of his shoulder symptoms.  This complaint is not work related. 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  Shoulder Surgical History:  The patient has had a rotator cuff repair and a decompression of his left shoulder in the past.  PMH/PSH/Family History/Social History/Meds/Allergies:  I have reviewed past medical, surgical, social and family history, medications and allergies as documented in the EMR.  Current Outpatient Medications: acetaminophen (TYLENOL) 500 MG tablet Take 1,000 mg by mouth every 8 (eight) hours as needed  atorvastatin (LIPITOR) 20 MG tablet TAKE 1 TABLET(20 MG) BY MOUTH EVERY DAY 30 tablet 3  busPIRone (BUSPAR) 5 MG tablet Take 1 tablet (5 mg total) by mouth 2 (two) times daily 60 tablet 3  celecoxib (CELEBREX) 200 MG capsule TAKE 1 CAPSULE(200 MG) BY MOUTH TWICE DAILY 60 capsule 1  cholecalciferol (VITAMIN D3) 1000 unit tablet Take by mouth  cyanocobalamin (VITAMIN B12) 100 MCG tablet Take by mouth  cyclobenzaprine (FLEXERIL) 5 MG tablet  Take 1-2 tablets (5-10 mg total) by mouth 3 (three) times daily as needed for Muscle spasms 30 tablet 0  diazePAM (VALIUM) 2 MG tablet  DULoxetine (CYMBALTA) 30 MG DR capsule Take 1 capsule (30 mg total) by mouth once daily 90 capsule 1  fenofibrate 160 MG tablet TAKE 1 TABLET(160 MG) BY MOUTH EVERY DAY 90 tablet 1  fluoride, sodium, (SODIUM FLUORIDE 5000 PLUS) 1.1 % USE AS DIRECTED DAILY  gabapentin (NEURONTIN) 300 MG capsule Take 1 capsule (300 mg total) by mouth 3 (three) times daily 270 capsule 1  glucosamine HCl/chondroitin su (GLUCOSAMINE-CHONDROITIN ORAL) Take 1 tablet by mouth once daily  hydroCHLOROthiazide (HYDRODIURIL) 12.5 MG tablet Take 1 tablet (12.5 mg total) by mouth once daily 90 tablet 1  HYDROcodone-acetaminophen (NORCO) 5-325 mg tablet Take 1 tablet by mouth every 6 (six) hours as needed for Pain for up to 30 doses 30 tablet 0  hydrOXYzine (ATARAX) 25 MG tablet  methylPREDNISolone (MEDROL DOSEPACK) 4 mg tablet as directed  NON FORMULARY  omega 3-dha-epa-fish oil (FISH OIL) 100-160-1,000 mg Cap Take by mouth once daily  ondansetron (ZOFRAN-ODT) 4 MG disintegrating tablet DISSOLVE ONE TABLET BY MOUTH EVERY 8 HOURS AS NEEDED FORNAUSEA AND VOMITING  psyllium seed, with dextrose, (FIBER ORAL) Take 1 tablet by mouth nightly  tiZANidine (ZANAFLEX) 4 MG tablet Take 1 tablet by mouth at bedtime as needed  VITAMIN B COMPLEX ORAL Take 1 tablet by mouth 2 (two) times daily  XTAMPZA ER 9 mg ER capsule TAKE 1 CAPSULE BY MOUTH EVERY 12 HOURS WITH FOOD   Allergies:  Lovastatin Other (nightmares)   Past Medical History:  Allergic rhinitis  Anxiety  Chronic myofascial pain  Depression  Hyperlipidemia  Hypertension  Osteoarthritis of right knee 11/09/2017   Past Surgical History:  Left arthroscopic rotator cuff repair, mini-open rotator cuff repair, open biceps tendoesis, extensive debridement of shoulder (glenohumeral & subacromial spaces) Left 10/18/2021 (Dr. Posey Pronto)  FRACTURE  SURGERY Left ankle  HERNIA REPAIR Bilateral  JOINT REPLACEMENT Right knee 2020  KNEE ARTHROSCOPY Right  Left ankle surgery  Right knee surgery   Family History:  Lung cancer Mother  Diabetes type II Sister  DECIST  Depression Sister   Social History:   Socioeconomic History:  Marital status: Married  Tobacco Use  Smoking status: Never  Passive exposure: Past  Smokeless tobacco: Never  Vaping Use  Vaping Use: Never used  Substance and Sexual Activity  Alcohol use: Not Currently  Drug use: Never  Sexual activity: Yes  Partners: Female  Birth control/protection: Pill  Social History Narrative  Marital Status- Married  Lives with wife and stepson  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  Physical Exam:  Vitals:  07/18/22 1133 07/18/22 1134  BP: 122/82  Weight: 75.4 kg (166 lb 3.2 oz)  Height: 167.6 cm (5' 6"$ )  PainSc: 8 8  PainLoc: Arm Arm   General/Constitutional: The patient appears to be well-nourished, well-developed, and in no acute distress. Neuro/Psych: Normal mood and affect, oriented to person, place and time. Eyes: Non-icteric. Pupils are equal,  round, and reactive to light, and exhibit synchronous movement. ENT: Unremarkable. Lymphatic: No palpable adenopathy. Respiratory: Lungs clear to auscultation, Normal chest excursion, No wheezes, and Non-labored breathing Cardiovascular: Regular rate and rhythm. No murmurs. and No edema, swelling or tenderness, except as noted in detailed exam. Integumentary: No impressive skin lesions present, except as noted in detailed exam. Musculoskeletal: Unremarkable, except as noted in detailed exam.  Right shoulder exam: SKIN: normal SWELLING: none WARMTH: none LYMPH NODES: no adenopathy  palpable CREPITUS: none TENDERNESS: Mildly tender along lateral acromion ROM (active):  Forward flexion: 165 degrees Abduction: 160 degrees Internal rotation: T11 ROM (passive):  Forward flexion: 170 degrees Abduction: 165 degrees ER/IR at 90 abd: 90 degrees / 65 degrees  He notes mild discomfort at the extremes of all motions.  STRENGTH: Forward flexion: 5/5 Abduction: 5/5 External rotation: 5/5 Internal rotation: 5/5 Pain with RC testing: He experiences mild discomfort with resisted forward flexion and abduction  STABILITY: Normal  SPECIAL TESTS: Luan Pulling' test: positive, mild-moderate Speed's test: Equivocally positive Capsulitis - pain w/ passive ER: no Crossed arm test: Positive Crank: Not evaluated Anterior apprehension: Negative Posterior apprehension: Not evaluated  He is neurovascularly intact to the right upper extremity.  Right Shoulder Imaging, MRI: MRI Shoulder Cartilage: Partial thickness humeral head cartilage loss. Small area of full-thickness articular cartilage loss of the humeral head. MRI Shoulder Rotator Cuff: Full thickness tear of the supraspinatus. No retraction. Articular sided partial-thickness tear of the infraspinatus tendon without retraction. MRI Shoulder Labrum / Biceps: Biceps tendinopathy. Degenerative tearing of the labrum. MRI Shoulder Bone: Normal bone.  Both the films and report were reviewed by myself and discussed with the patient.  Assessment:  1. Rotator cuff tendinitis, right.  2. Nontraumatic complete tear of right rotator cuff.  3. Tendinitis of upper biceps tendon of right shoulder.  4. Degenerative tear of glenoid labrum of right shoulder.   Plan:  The treatment options were discussed with the patient and his wife. In addition, patient educational materials were provided regarding the diagnosis and treatment options. The patient is quite frustrated by his symptoms and function limitations, and is ready to consider more  aggressive treatment options. Therefore, I have recommended a surgical procedure, specifically a right shoulder arthroscopy with debridement, decompression, rotator cuff repair, and probable biceps tenodesis. The procedure was discussed with the patient, as were the potential risks (including bleeding, infection, nerve and/or blood vessel injury, persistent or recurrent pain, failure of the repair, progression of arthritis, need for further surgery, blood clots, strokes, heart attacks and/or arhythmias, pneumonia, etc.) and benefits. The patient states his understanding and wishes to proceed. All of the patient's questions and concerns were answered. He can call any time with further concerns. He will follow up post-surgery, routine.    H&P reviewed and patient re-examined. No changes.

## 2022-08-07 NOTE — Anesthesia Preprocedure Evaluation (Signed)
Anesthesia Evaluation  Patient identified by MRN, date of birth, ID band Patient awake    Reviewed: Allergy & Precautions, NPO status   History of Anesthesia Complications Negative for: history of anesthetic complications  Airway Mallampati: III  TM Distance: >3 FB     Dental  (+) Dental Advidsory Given, Implants, Caps   Pulmonary neg pulmonary ROS   Pulmonary exam normal        Cardiovascular Exercise Tolerance: Good  Rhythm:Regular Rate:Normal  HLD   Neuro/Psych  PSYCHIATRIC DISORDERS Anxiety Depression    negative neurological ROS     GI/Hepatic negative GI ROS, Neg liver ROS,,,  Endo/Other  negative endocrine ROS    Renal/GU negative Renal ROS     Musculoskeletal   Abdominal   Peds  Hematology   Anesthesia Other Findings Past Medical History: No date: Anxiety No date: Depression No date: Hyperlipidemia No date: Nerve pain No date: Neuromuscular disorder (HCC)     Comment:  neuropathy mid section and legs   Reproductive/Obstetrics                             Anesthesia Physical Anesthesia Plan  ASA: 2  Anesthesia Plan: General and Regional   Post-op Pain Management: Regional block   Induction: Intravenous  PONV Risk Score and Plan: 2 and Treatment may vary due to age or medical condition, Midazolam, Dexamethasone and Ondansetron  Airway Management Planned: Oral ETT  Additional Equipment:   Intra-op Plan:   Post-operative Plan: Extubation in OR  Informed Consent: I have reviewed the patients History and Physical, chart, labs and discussed the procedure including the risks, benefits and alternatives for the proposed anesthesia with the patient or authorized representative who has indicated his/her understanding and acceptance.     Dental advisory given  Plan Discussed with: CRNA  Anesthesia Plan Comments:         Anesthesia Quick Evaluation

## 2022-08-07 NOTE — Anesthesia Procedure Notes (Signed)
Procedure Name: Intubation Date/Time: 08/07/2022 10:38 AM  Performed by: Chanetta Marshall, CRNAPre-anesthesia Checklist: Patient identified, Emergency Drugs available, Suction available and Patient being monitored Patient Re-evaluated:Patient Re-evaluated prior to induction Oxygen Delivery Method: Circle system utilized Preoxygenation: Pre-oxygenation with 100% oxygen Induction Type: IV induction Ventilation: Mask ventilation without difficulty Laryngoscope Size: McGraph and 3 Grade View: Grade II Tube type: Oral Tube size: 7.0 mm Number of attempts: 1 Airway Equipment and Method: Video-laryngoscopy Placement Confirmation: ETT inserted through vocal cords under direct vision, positive ETCO2, breath sounds checked- equal and bilateral and CO2 detector Secured at: 21 cm Tube secured with: Tape Dental Injury: Teeth and Oropharynx as per pre-operative assessment

## 2022-08-08 ENCOUNTER — Encounter: Payer: Self-pay | Admitting: Surgery

## 2022-08-14 NOTE — Anesthesia Postprocedure Evaluation (Signed)
Anesthesia Post Note  Patient: Hector Gonzalez  Procedure(s) Performed: SHOULDER ARTHROSCOPY WITH DEBRIDEMENT, DECOMPRESSION, ROTATOR CUFF REPAIR AND BICEPS TENODESIS SLAP LESSION REPAIR (Right: Shoulder)  Patient location during evaluation: PACU Anesthesia Type: Regional Level of consciousness: awake and alert Pain management: pain level controlled Vital Signs Assessment: post-procedure vital signs reviewed and stable Respiratory status: spontaneous breathing, nonlabored ventilation, respiratory function stable and patient connected to nasal cannula oxygen Cardiovascular status: blood pressure returned to baseline and stable Postop Assessment: no apparent nausea or vomiting Anesthetic complications: no   No notable events documented.   Last Vitals:  Vitals:   08/07/22 1315 08/07/22 1330  BP: 122/78 119/77  Pulse: 77 85  Resp: 16 15  Temp: (!) 36.1 C (!) 36.1 C  SpO2: 96% 94%    Last Pain:  Vitals:   08/07/22 1330  TempSrc: Temporal  PainSc: 0-No pain                 Martha Clan

## 2022-09-15 ENCOUNTER — Other Ambulatory Visit: Payer: Self-pay | Admitting: Neurosurgery

## 2022-11-26 ENCOUNTER — Other Ambulatory Visit: Payer: Self-pay

## 2022-11-26 DIAGNOSIS — Z9189 Other specified personal risk factors, not elsewhere classified: Secondary | ICD-10-CM

## 2022-11-26 DIAGNOSIS — Z0000 Encounter for general adult medical examination without abnormal findings: Secondary | ICD-10-CM

## 2022-12-04 ENCOUNTER — Inpatient Hospital Stay: Admission: RE | Admit: 2022-12-04 | Payer: BC Managed Care – PPO | Source: Ambulatory Visit

## 2022-12-28 ENCOUNTER — Other Ambulatory Visit: Payer: Self-pay | Admitting: Neurosurgery

## 2022-12-29 NOTE — Telephone Encounter (Signed)
Notified pt of refill denial and suggested he contact [facility] for further refills of celebrex

## 2022-12-29 NOTE — Telephone Encounter (Signed)
Last seen by Dr. Myer Haff on 05/13/22.   He has been seeing [facility]. He should get further refills of celebrex from them.   I denied the celebrex prescription.   Please let him know.

## 2023-03-03 ENCOUNTER — Other Ambulatory Visit: Payer: Self-pay | Admitting: Family Medicine

## 2023-03-03 DIAGNOSIS — E782 Mixed hyperlipidemia: Secondary | ICD-10-CM

## 2023-03-03 DIAGNOSIS — Z9189 Other specified personal risk factors, not elsewhere classified: Secondary | ICD-10-CM

## 2023-08-17 IMAGING — MR MR SHOULDER*R* W/O CM
4 of 5 series · 31 of 40 positions shown · non-contrast
Comparison: None.

CLINICAL DATA: Fall 3 years ago. Right shoulder pain progressively
getting worse.

EXAM:
MRI OF THE RIGHT SHOULDER WITHOUT CONTRAST
TECHNIQUE: Multiplanar, multisequence MR imaging of the shoulder was performed.
No intravenous contrast was administered.

[Series 5: T2 fat-sat · axial · right · 4.0mm · 0.44mm/px · z∈[-81,+39]mm · 8 of 26 slices shown (1 of 3)]
[im 1/26]
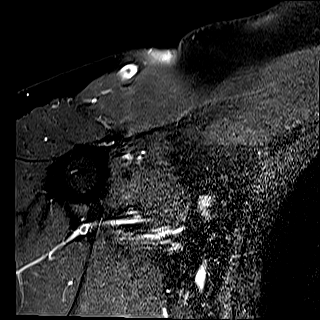
[im 4/26]
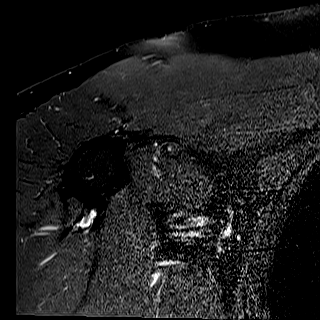
[im 8/26]
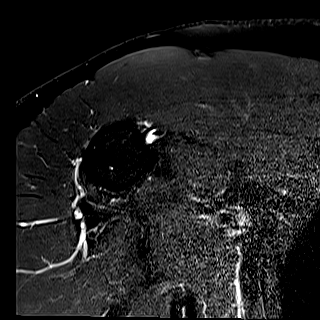
[im 11/26]
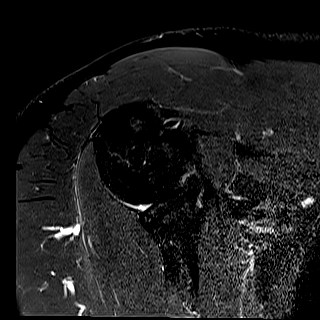
[im 15/26]
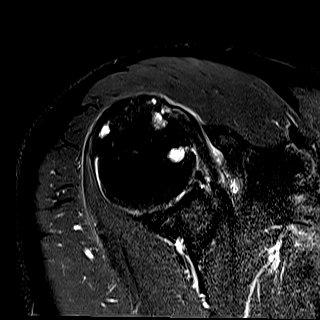
[im 18/26]
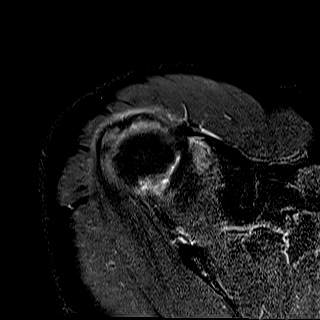
[im 22/26]
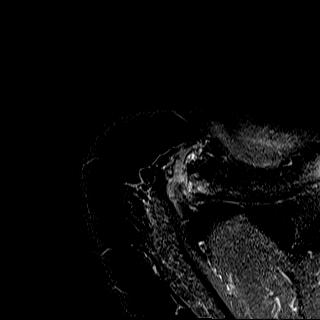
[im 26/26]
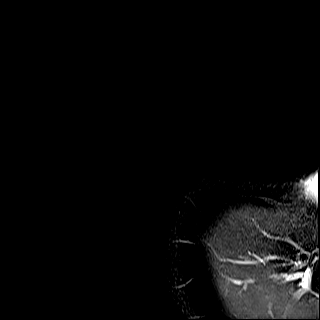

[Series 6: PD · sagittal · right · 4.0mm · 0.44mm/px · 9 of 26 slices shown]
[im 1/26]
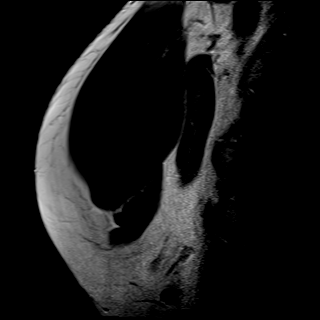
[im 4/26]
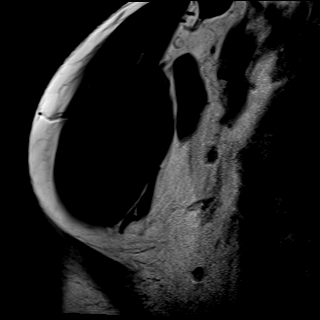
[im 7/26]
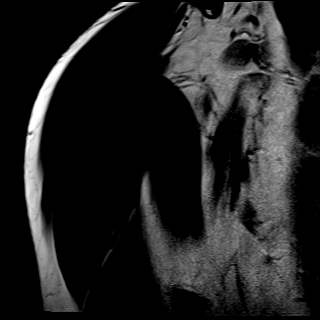
[im 10/26]
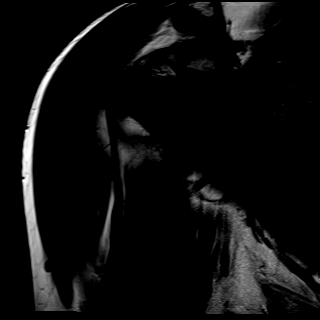
[im 13/26]
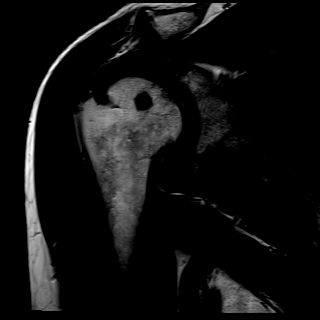
[im 16/26]
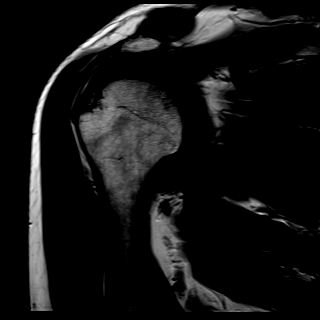
[im 19/26]
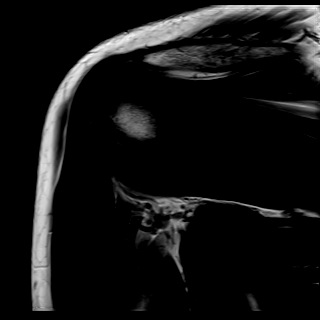
[im 22/26]
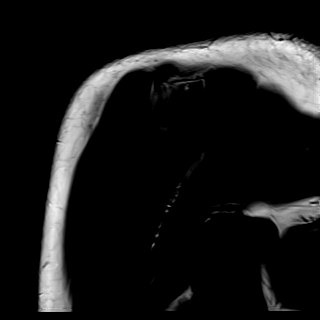
[im 26/26]
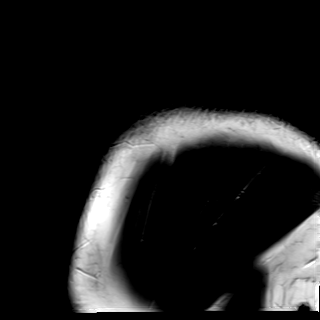

[Series 7: T2 fat-sat · sagittal · right · 4.0mm · 0.44mm/px · 9 of 26 slices shown (2 of 3)]
[im 1/26]
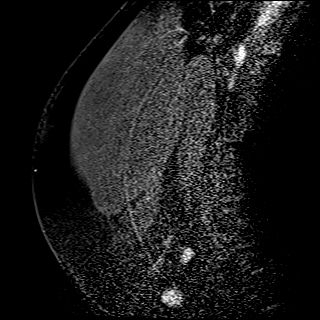
[im 4/26]
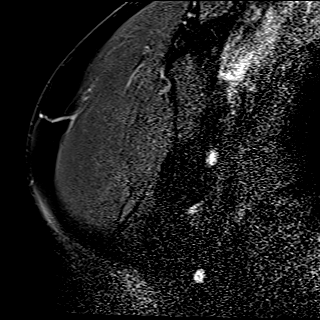
[im 7/26]
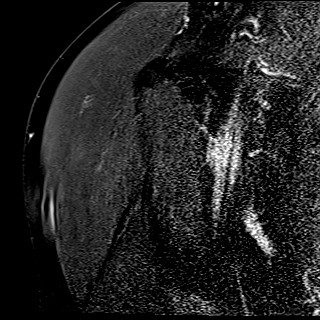
[im 10/26]
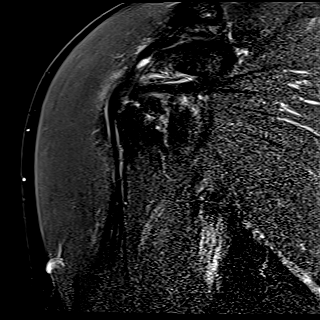
[im 13/26]
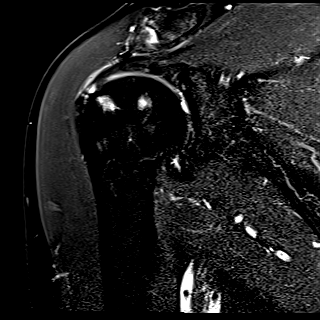
[im 16/26]
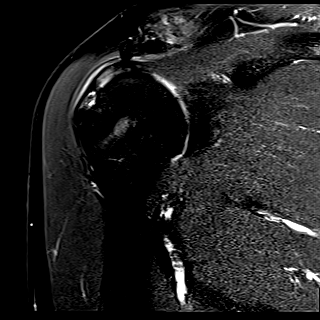
[im 19/26]
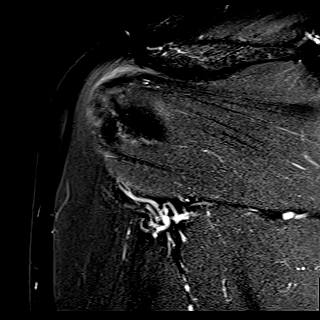
[im 22/26]
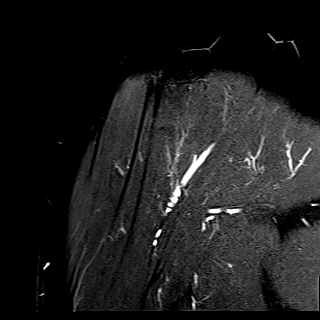
[im 26/26]
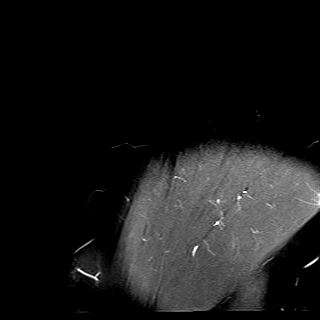

[Series 8: T2 fat-sat · coronal · right · 4.0mm · 0.22mm/px · 5 of 22 slices shown (3 of 3)]
[im 1/22]
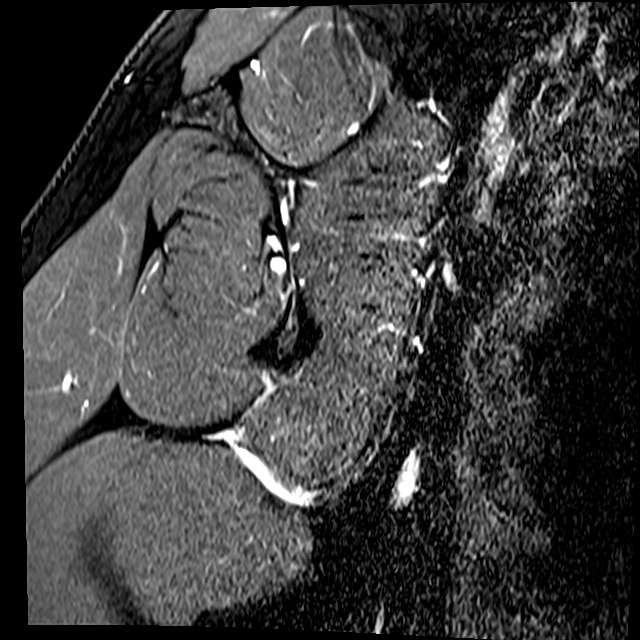
[im 4/22]
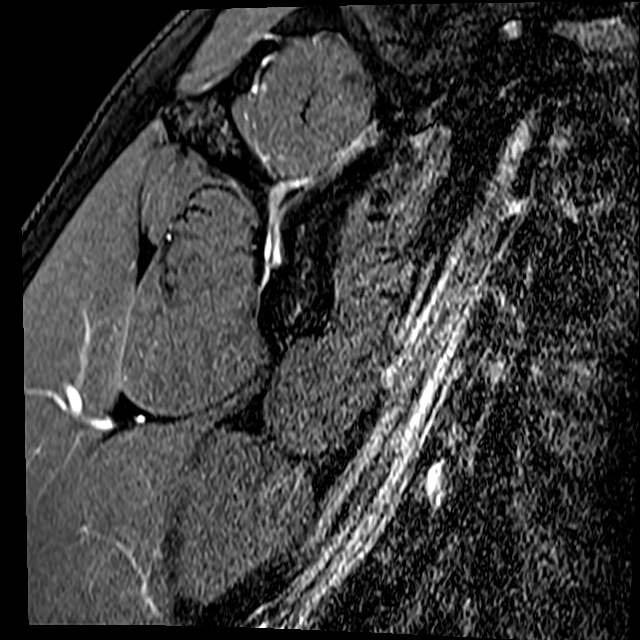
[im 8/22]
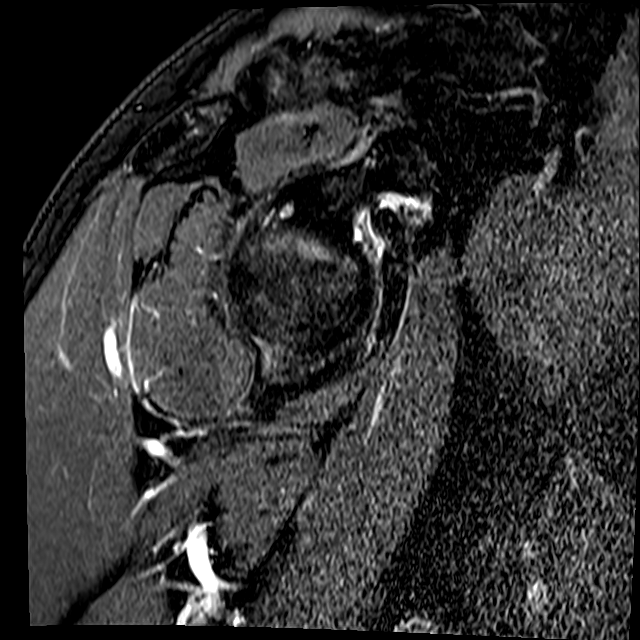
[im 11/22]
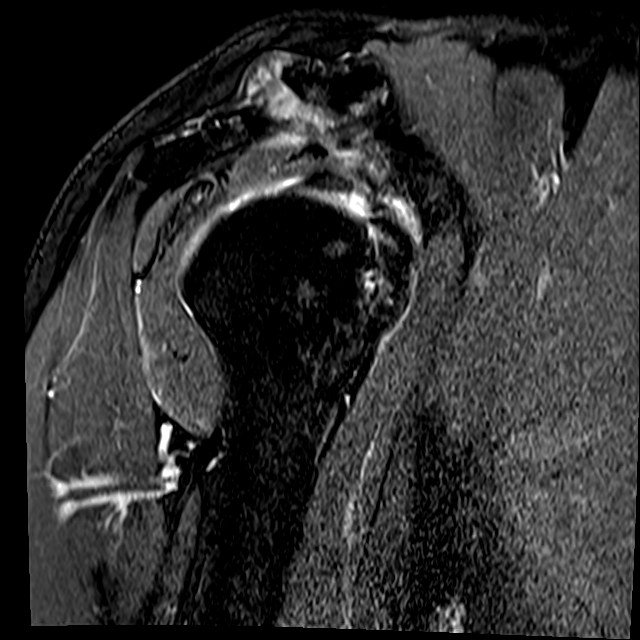
[im 18/22]
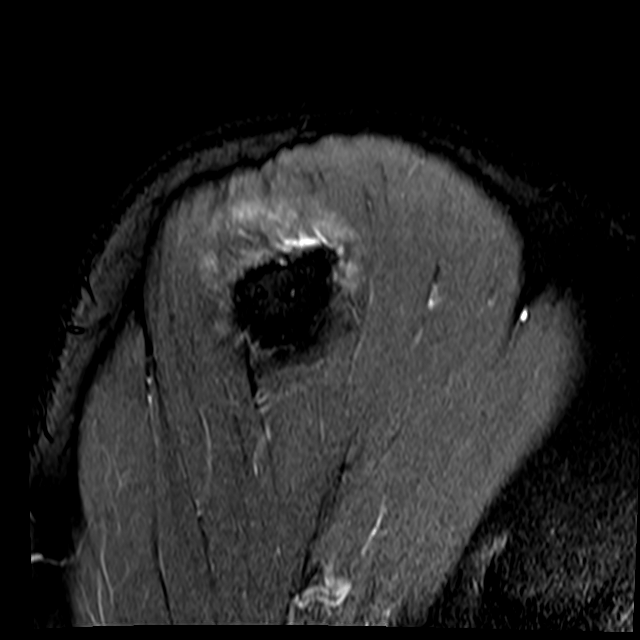

[31 of 40 positions shown; findings below may reference images not displayed]

FINDINGS: Rotator cuff: Advanced tendinopathy of the supraspinatus with
high-grade/near complete insertional tear of the supraspinatus
(series 7, image 13; series 8, image 6). Infraspinatus tendinopathy
with low-grade partial-thickness insertional tears. Teres minor
tendon is intact. Tendinopathy of the subscapularis with
partial-thickness insertional tear.

Muscles: No muscle atrophy or edema. No intramuscular fluid
collection or hematoma.

Biceps Long Head: Tendinopathy and thinning of the intra-articular
long head of the biceps.

Acromioclavicular Joint: Moderate arthropathy of the
acromioclavicular joint characterized by joint capsular thickening
and subacromial osteophytes. No subacromial/subdeltoid bursal fluid.

Glenohumeral Joint: No joint effusion. Articular cartilage thinning
with focal full-thickness defect of the humeral head.

Labrum: Advanced degenerative changes of the labrum without discrete
tear on this non-arthrographic examination. Intraosseous cyst of the
superior glenoid.

Bones: No fracture or dislocation. No aggressive osseous lesion.

Other: No fluid collection or hematoma.
IMPRESSION: 1. Advanced tendinopathy with high-grade partial-thickness articular
surface tear of the supraspinatus.

2. Tendinopathy of the subscapularis and infraspinatus with
low-grade partial-thickness insertional tears. Tendinopathy of the
intra-articular long head of the biceps without tear.

3.  Moderate to severe acromioclavicular osteoarthritis.

4.  Moderate glenohumeral joint osteoarthritis.

5.  No evidence of acute fracture or dislocation.

## 2023-10-30 ENCOUNTER — Other Ambulatory Visit: Payer: Self-pay | Admitting: Family Medicine

## 2023-10-30 DIAGNOSIS — Z9189 Other specified personal risk factors, not elsewhere classified: Secondary | ICD-10-CM

## 2023-10-30 DIAGNOSIS — E782 Mixed hyperlipidemia: Secondary | ICD-10-CM
